# Patient Record
Sex: Female | Born: 1955 | Race: White | Hispanic: No | Marital: Married | Smoking: Never smoker
Health system: Southern US, Community
[De-identification: ages and names within clinical notes are randomized; demographics above are authoritative.]

## PROBLEM LIST (undated history)

## (undated) DIAGNOSIS — T7840XA Allergy, unspecified, initial encounter: Secondary | ICD-10-CM

## (undated) DIAGNOSIS — J329 Chronic sinusitis, unspecified: Secondary | ICD-10-CM

## (undated) DIAGNOSIS — C439 Malignant melanoma of skin, unspecified: Secondary | ICD-10-CM

## (undated) DIAGNOSIS — C801 Malignant (primary) neoplasm, unspecified: Secondary | ICD-10-CM

## (undated) DIAGNOSIS — F411 Generalized anxiety disorder: Secondary | ICD-10-CM

## (undated) HISTORY — DX: Malignant melanoma of skin, unspecified: C43.9

## (undated) HISTORY — PX: OTHER SURGICAL HISTORY: SHX169

## (undated) HISTORY — DX: Allergy, unspecified, initial encounter: T78.40XA

## (undated) HISTORY — PX: COLONOSCOPY: SHX174

## (undated) HISTORY — DX: Generalized anxiety disorder: F41.1

---

## 1998-06-10 ENCOUNTER — Other Ambulatory Visit: Admission: RE | Admit: 1998-06-10 | Discharge: 1998-06-10 | Payer: Self-pay | Admitting: Gynecology

## 1999-09-08 ENCOUNTER — Other Ambulatory Visit: Admission: RE | Admit: 1999-09-08 | Discharge: 1999-09-08 | Payer: Self-pay | Admitting: Obstetrics and Gynecology

## 2000-11-29 ENCOUNTER — Other Ambulatory Visit: Admission: RE | Admit: 2000-11-29 | Discharge: 2000-11-29 | Payer: Self-pay | Admitting: Obstetrics and Gynecology

## 2002-12-04 ENCOUNTER — Other Ambulatory Visit: Admission: RE | Admit: 2002-12-04 | Discharge: 2002-12-04 | Payer: Self-pay | Admitting: Obstetrics and Gynecology

## 2004-11-21 ENCOUNTER — Other Ambulatory Visit: Admission: RE | Admit: 2004-11-21 | Discharge: 2004-11-21 | Payer: Self-pay | Admitting: Obstetrics and Gynecology

## 2006-01-08 ENCOUNTER — Other Ambulatory Visit: Admission: RE | Admit: 2006-01-08 | Discharge: 2006-01-08 | Payer: Self-pay | Admitting: Obstetrics and Gynecology

## 2008-03-22 ENCOUNTER — Encounter (INDEPENDENT_AMBULATORY_CARE_PROVIDER_SITE_OTHER): Payer: Self-pay | Admitting: Obstetrics and Gynecology

## 2008-03-22 ENCOUNTER — Ambulatory Visit (HOSPITAL_COMMUNITY): Admission: RE | Admit: 2008-03-22 | Discharge: 2008-03-22 | Payer: Self-pay | Admitting: Obstetrics and Gynecology

## 2011-02-03 NOTE — Op Note (Signed)
Martha Brock, Martha Brock              ACCOUNT NO.:  [id number]   MEDICAL RECORD NO.:  [id number]          PATIENT TYPE:  AMB   LOCATION:  SDC                           FACILITY:  [facility]   PHYSICIAN:  Michelle L. Grewal, M.D.DATE OF BIRTH:  Sep 11, 1956   DATE OF PROCEDURE:  03/22/2008  DATE OF DISCHARGE:                               OPERATIVE REPORT   PREOPERATIVE DIAGNOSIS:  Menorrhagia.   POSTOPERATIVE DIAGNOSIS:  Menorrhagia.   PROCEDURE:  D&C, hysteroscopy, ThermaChoice endometrial ablation.   SURGEON:  Michelle L. Vincente Poli, MD   ANESTHESIA:  MAC with local.   FINDINGS:  Normal cavities.   SPECIMENS:  Uterine curettings.   PATHOLOGY:  Uterine curettings.   ESTIMATED BLOOD LOSS:  Minimal.   COMPLICATIONS:  None.   PROCEDURE IN DETAIL:  The patient was taken to the operating room where  she was given anesthesia.  She was prepped and draped in the usual  sterile fashion and in-and-out catheter was used to empty the bladder.  After the patient was draped, a speculum was inserted.  The cervix was  grasped with a tenaculum and a paracervical block was performed in a  standard fashion.  The cervical internal os was gently dilated using  Pratt dilators.  The diagnostic hysteroscope was inserted and with good  distention, the cavity was noted to be normal.  The hysteroscope was  removed and a curette was inserted and a thorough uterine curettage was  performed.  This tissue was sent to pathology.  The ThermaChoice III was  inserted and ThermaChoice endometrial ablation was performed for 8  minutes according to the manufacturer's specifications.  The pressure  maintained at around the 150 throughout the procedure.  At the end of  the procedure, the intact balloon was removed.  Approximately 12 mL was  inserted through the balloon.  All instruments were removed from the  vagina.  All sponge, lap, and instrument counts were correct x2.  The  patient went to recovery room in stable  condition.      Michelle L. Vincente Poli, M.D.  Electronically Signed    MLG/MEDQ  D:  03/22/2008  T:  03/23/2008  Job:  [id number]

## 2011-06-18 LAB — CBC
HCT: 36.4
Hemoglobin: 12.5
MCHC: 34.5
MCV: 97.7
Platelets: 315
RBC: 3.72 — ABNORMAL LOW
RDW: 12.6
WBC: 4.8

## 2011-06-18 LAB — DIFFERENTIAL
Basophils Absolute: 0
Basophils Relative: 1
Eosinophils Absolute: 0.1
Eosinophils Relative: 1
Lymphocytes Relative: 23
Lymphs Abs: 1.1
Monocytes Absolute: 0.4
Monocytes Relative: 8
Neutro Abs: 3.2
Neutrophils Relative %: 67

## 2011-06-18 LAB — PREGNANCY, URINE: Preg Test, Ur: NEGATIVE

## 2012-09-21 HISTORY — PX: MELANOMA EXCISION: SHX5266

## 2016-07-21 ENCOUNTER — Telehealth: Payer: Self-pay

## 2016-07-21 NOTE — Telephone Encounter (Signed)
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

## 2016-07-31 NOTE — Telephone Encounter (Signed)
Triaged today. She had previous chart, Martha Brock.

## 2016-08-06 NOTE — Telephone Encounter (Signed)
I have sent IT a note that I need the charts combined.

## 2016-08-18 NOTE — Telephone Encounter (Signed)
Gastroenterology Pre-Procedure Review  Pt's husband sees Dr. Gala Romney  Her last one done in [place] and doctor retired  Request Date: 08/18/2016 Requesting Physician: PT is requesting  PATIENT REVIEW QUESTIONS: The patient responded to the following health history questions as indicated:    1. Diabetes Melitis: no 2. Joint replacements in the past 12 months: no 3. Major health problems in the past 3 months: no 4. Has an artificial valve or MVP: no 5. Has a defibrillator: no 6. Has been advised in past to take antibiotics in advance of a procedure like teeth cleaning: no 7. Family history of colon cancer: no  8. Alcohol Use: occasionally 9. History of sleep apnea: no  10. History of coronary artery or other vascular stents placed within the last 12 months: no    MEDICATIONS & ALLERGIES:    Patient reports the following regarding taking any blood thinners:   Plavix? no Aspirin? no Coumadin? no Brilinta? no Xarelto? no Eliquis? no Pradaxa? no Savaysa? no Effient? no  Patient confirms/reports the following medications:  Current Outpatient Prescriptions  Medication Sig Dispense Refill  . Diethylpropion HCl CR 75 MG TB24 Take by mouth daily.     No current facility-administered medications for this visit.     Patient confirms/reports the following allergies:  Allergies  Allergen Reactions  . Sulfa Antibiotics Nausea Only    No orders of the defined types were placed in this encounter.   AUTHORIZATION INFORMATION Primary Insurance:   ID #: Group #:  Pre-Cert / Auth required:  Pre-Cert / Auth #:   Secondary Insurance:   ID #:   Group #:  Pre-Cert / Auth required:  Pre-Cert / Auth #:   SCHEDULE INFORMATION: Procedure has been scheduled as follows:  Date:                   Time:   Location:   This Gastroenterology Pre-Precedure Review Form is being routed to the following provider(s): R. Garfield Cornea, MD

## 2016-08-20 NOTE — Telephone Encounter (Signed)
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 She would like to just wait til after the first of the year and be scheduled on a Friday.  Tentatively scheduled for 09/25/2016 at 9:30 AM and I will send in a new triage at that time.

## 2016-09-22 ENCOUNTER — Telehealth: Payer: Self-pay

## 2016-09-22 ENCOUNTER — Other Ambulatory Visit: Payer: Self-pay

## 2016-09-22 DIAGNOSIS — Z1211 Encounter for screening for malignant neoplasm of colon: Secondary | ICD-10-CM

## 2016-09-22 MED ORDER — PEG 3350-KCL-NA BICARB-NACL 420 G PO SOLR: 4000.0000 mL | ORAL | 0 refills | Status: DC

## 2016-09-22 NOTE — Telephone Encounter (Signed)
Pt is aware of instructions faxed to the pharmacy and Rx was sent in. She will call tomorrow if she has questions.

## 2016-09-22 NOTE — Telephone Encounter (Signed)
How much alcohol?

## 2016-09-22 NOTE — Telephone Encounter (Signed)
Just occasional.

## 2016-09-22 NOTE — Telephone Encounter (Signed)
Gastroenterology Pre-Procedure Review  Request Date: 09/22/2016 Requesting Physician:   PATIENT REVIEW QUESTIONS: The patient responded to the following health history questions as indicated:    1. Diabetes Melitis: no 2. Joint replacements in the past 12 months: no 3. Major health problems in the past 3 months: no 4. Has an artificial valve or MVP: no 5. Has a defibrillator: no 6. Has been advised in past to take antibiotics in advance of a procedure like teeth cleaning: no 7. Family history of colon cancer: no  8. Alcohol Use: YES 9. History of sleep apnea: no  10. History of coronary artery or other vascular stents placed within the last 12 months: no    MEDICATIONS & ALLERGIES:    Patient reports the following regarding taking any blood thinners:   Plavix? no Aspirin? no Coumadin? no Brilinta? no Xarelto? no Eliquis? no Pradaxa? no Savaysa? no Effient? no  Patient confirms/reports the following medications:  Current Outpatient Prescriptions  Medication Sig Dispense Refill  . Diethylpropion HCl CR 75 MG TB24 Take by mouth daily.     No current facility-administered medications for this visit.     Patient confirms/reports the following allergies:  Allergies  Allergen Reactions  . Sulfa Antibiotics Nausea Only    No orders of the defined types were placed in this encounter.   AUTHORIZATION INFORMATION Primary Insurance:   ID #:  Group #:  Pre-Cert / Auth required:  Pre-Cert / Auth #:   Secondary Insurance:  ID #:   Group #:  Pre-Cert / Auth required:  Pre-Cert / Auth #:   SCHEDULE INFORMATION: Procedure has been scheduled as follows:  Date: 09/25/2016               Time: 9:45 Am Location: [facility] Short Stay  This Gastroenterology Pre-Precedure Review Form is being routed to the following provider(s): R. Garfield Cornea, MD

## 2016-09-22 NOTE — Telephone Encounter (Signed)
Appropriate.

## 2016-09-24 NOTE — Telephone Encounter (Signed)
No PA is needed for TCS 

## 2016-09-25 ENCOUNTER — Encounter (HOSPITAL_COMMUNITY)
Admission: RE | Disposition: A | Discharge status: Home / Self Care | Payer: Self-pay | Source: Ambulatory Visit | Attending: Internal Medicine

## 2016-09-25 ENCOUNTER — Ambulatory Visit (HOSPITAL_COMMUNITY)
Admission: RE | Admit: 2016-09-25 | Discharge: 2016-09-25 | Disposition: A | Discharge status: Home / Self Care | Payer: BLUE CROSS/BLUE SHIELD | Source: Ambulatory Visit | Attending: Internal Medicine | Admitting: Internal Medicine

## 2016-09-25 ENCOUNTER — Encounter (HOSPITAL_COMMUNITY): Payer: Self-pay | Admitting: *Deleted

## 2016-09-25 DIAGNOSIS — K640 First degree hemorrhoids: Secondary | ICD-10-CM | POA: Insufficient documentation

## 2016-09-25 DIAGNOSIS — Z1212 Encounter for screening for malignant neoplasm of rectum: Secondary | ICD-10-CM | POA: Diagnosis not present

## 2016-09-25 DIAGNOSIS — K5730 Diverticulosis of large intestine without perforation or abscess without bleeding: Secondary | ICD-10-CM | POA: Diagnosis not present

## 2016-09-25 DIAGNOSIS — Z1211 Encounter for screening for malignant neoplasm of colon: Secondary | ICD-10-CM | POA: Diagnosis not present

## 2016-09-25 DIAGNOSIS — Z79899 Other long term (current) drug therapy: Secondary | ICD-10-CM | POA: Diagnosis not present

## 2016-09-25 HISTORY — DX: Malignant (primary) neoplasm, unspecified: C80.1

## 2016-09-25 HISTORY — PX: COLONOSCOPY: SHX5424

## 2016-09-25 HISTORY — DX: Chronic sinusitis, unspecified: J32.9

## 2016-09-25 SURGERY — COLONOSCOPY
Anesthesia: Moderate Sedation

## 2016-09-25 MED ORDER — SODIUM CHLORIDE 0.9 % IV SOLN
INTRAVENOUS | Status: DC
  Administered 2016-09-25: 09:00:00 via INTRAVENOUS

## 2016-09-25 MED ORDER — MEPERIDINE HCL 100 MG/ML IJ SOLN
INTRAMUSCULAR | Status: DC | PRN
  Administered 2016-09-25: 50 mg via INTRAVENOUS
  Administered 2016-09-25 (×2): 25 mg via INTRAVENOUS

## 2016-09-25 MED ORDER — MIDAZOLAM HCL 5 MG/5ML IJ SOLN
INTRAMUSCULAR | Status: DC
Start: 2016-09-25 — End: 2016-09-25
  Filled 2016-09-25: qty 10

## 2016-09-25 MED ORDER — ONDANSETRON HCL 4 MG/2ML IJ SOLN
INTRAMUSCULAR | Status: DC | PRN
  Administered 2016-09-25: 4 mg via INTRAVENOUS

## 2016-09-25 MED ORDER — ONDANSETRON HCL 4 MG/2ML IJ SOLN
INTRAMUSCULAR | Status: AC
  Filled 2016-09-25: qty 2

## 2016-09-25 MED ORDER — MEPERIDINE HCL 100 MG/ML IJ SOLN
INTRAMUSCULAR | Status: AC
  Filled 2016-09-25: qty 2

## 2016-09-25 MED ORDER — MIDAZOLAM HCL 5 MG/5ML IJ SOLN
INTRAMUSCULAR | Status: DC | PRN
  Administered 2016-09-25: 1 mg via INTRAVENOUS
  Administered 2016-09-25: 2 mg via INTRAVENOUS
  Administered 2016-09-25: 1 mg via INTRAVENOUS
  Administered 2016-09-25: 2 mg via INTRAVENOUS
  Administered 2016-09-25: 1 mg via INTRAVENOUS

## 2016-09-25 MED ORDER — STERILE WATER FOR IRRIGATION IR SOLN
Status: DC | PRN
  Administered 2016-09-25: 2.5 mL

## 2016-09-25 NOTE — H&P (Signed)
@LOGO @   Primary Care Physician:  Cyril Mourning, MD Primary Gastroenterologist:  Dr. Gala Romney  Pre-Procedure History & Physical: HPI:  Martha Brock is a 61 y.o. female is here for a screening colonoscopy. Negative colonoscopy 10 years ago in [place].  No bowel symptoms. No family history of colon cancer.  Past Medical History:  Diagnosis Date  . Sinusitis     Past Surgical History:  Procedure Laterality Date  . CESAREAN SECTION     X 2  . COLONOSCOPY    . Hysteroscopy and ablation       Prior to Admission medications   Medication Sig Start Date End Date Taking? Authorizing Provider  Acetaminophen-Caffeine (EXCEDRIN TENSION HEADACHE) 500-65 MG TABS Take 2 tablets by mouth daily as needed (headaches).   Yes Historical Provider, MD  Ascorbic Acid (VITAMIN C) 1000 MG tablet Take 1,000 mg by mouth daily.   Yes Historical Provider, MD  Chlorpheniramine-Phenylephrine ([facility] S[facility] ALLERGY MAX ST PO) Take 1 tablet by mouth daily as needed (allergies).   Yes Historical Provider, MD  Cholecalciferol (VITAMIN D3) 3000 units TABS Take 3,000 Units by mouth daily.   Yes Historical Provider, MD  Diethylpropion HCl CR 75 MG TB24 Take 75 mg by mouth daily.    Yes Historical Provider, MD  Glucosamine HCl (GLUCOSAMINE PO) Take 1 tablet by mouth daily.   Yes Historical Provider, MD  Ibuprofen-Diphenhydramine HCl (ADVIL PM) 200-25 MG CAPS Take 1-2 tablets by mouth daily as needed (pain/sleep).   Yes Historical Provider, MD  polyethylene glycol-electrolytes (TRILYTE) 420 g solution Take 4,000 mLs by mouth as directed. 09/22/16  Yes Daneil Dolin, MD    Allergies as of 09/22/2016 - Review Complete 09/22/2016  Allergen Reaction Noted  . Sulfa antibiotics Nausea Only 08/18/2016    Family History  Problem Relation Age of Onset  . Colon cancer Neg Hx     Social History   Social History  . Marital status: Married    Spouse name: N/A  . Number of children: N/A  . Years of education: N/A    Occupational History  . Not on file.   Social History Main Topics  . Smoking status: Never Smoker  . Smokeless tobacco: Never Used  . Alcohol use Yes     Comment: occasionally  . Drug use: No  . Sexual activity: Not on file   Other Topics Concern  . Not on file   Social History Narrative  . No narrative on file    Review of Systems: See HPI, otherwise negative ROS  Physical Exam: BP 140/74   Pulse 90   Temp 98.4 F (36.9 C) (Oral)   Resp 12   Ht 5' 3.5" (1.613 m)   Wt 168 lb (76.2 kg)   SpO2 99%   BMI 29.29 kg/m  Mouth:  No deformity or lesions, dentition normal. Neck:  Supple; no masses or thyromegaly. Lungs:  Clear throughout to auscultation.   No wheezes, crackles, or rhonchi. No acute distress. Heart:  Regular rate and rhythm; no murmurs, clicks, rubs,  or gallops. Abdomen:  Soft, nontender and nondistended. No masses, hepatosplenomegaly or hernias noted. Normal bowel sounds, without guarding, and without rebound.     Impression/Plan: Martha Brock is now here to undergo a screening colonoscopy.  Average risk examination.     Risks, benefits, limitations, imponderables and alternatives regarding colonoscopy have been reviewed with the patient. Questions have been answered. All parties agreeable.     Notice:  This dictation was prepared with  Dragon dictation along with smaller Company secretary. Any transcriptional errors that result from this process are unintentional and may not be corrected upon review.

## 2016-09-25 NOTE — Op Note (Signed)
[facility] Patient Name: Martha Brock Procedure Date: 09/25/2016 9:06 AM MRN: [id number] Date of Birth: Feb 07, 1956 Attending MD: Norvel Richards , MD CSN: [id number] Age: 61 Admit Type: Outpatient Procedure:                Colonoscopy - screening Indications:              Screening for colorectal malignant neoplasm Providers:                Norvel Richards, MD, Lurline Del, RN, Purcell Nails.                            [place], Merchant navy officer, Aram Candela Referring MD:              Medicines:                Midazolam 7 mg IV, Meperidine 100 mg IV,                            Ondansetron 4 mg IV Complications:            No immediate complications. Estimated Blood Loss:     Estimated blood loss: none. Procedure:                Pre-Anesthesia Assessment:                           - Prior to the procedure, a History and Physical                            was performed, and patient medications and                            allergies were reviewed. The patient's tolerance of                            previous anesthesia was also reviewed. The risks                            and benefits of the procedure and the sedation                            options and risks were discussed with the patient.                            All questions were answered, and informed consent                            was obtained. Prior Anticoagulants: The patient has                            taken no previous anticoagulant or antiplatelet                            agents. ASA Grade Assessment: II - A patient with  mild systemic disease. After reviewing the risks                            and benefits, the patient was deemed in                            satisfactory condition to undergo the procedure.                           After obtaining informed consent, the colonoscope                            was passed under direct vision. Throughout the                             procedure, the patient's blood pressure, pulse, and                            oxygen saturations were monitored continuously. The                            [id number] scope was introduced through                            the anus and advanced to the 5 cm into the ileum.                            The colonoscopy was performed without difficulty.                            The patient tolerated the procedure well. The                            quality of the bowel preparation was adequate. The                            terminal ileum, ileocecal valve, appendiceal                            orifice, and rectum were photographed. The quality                            of the bowel preparation was adequate. The entire                            colon was well visualized. Scope In: 9:37:49 AM Scope Out: 9:54:38 AM Scope Withdrawal Time: 0 hours 7 minutes 34 seconds  Total Procedure Duration: 0 hours 16 minutes 49 seconds  Findings:      The perianal and digital rectal examinations were normal. Grade 1       hemorrhoids and anal papilla.      Scattered diverticula were found in the sigmoid colon and descending       colon.      The exam was otherwise without abnormality  on direct and retroflexion       views. Impression:               - Diverticulosis in the sigmoid colon and in the                            descending colon.                           - The examination was otherwise normal on direct                            and retroflexion views.                           - No specimens collected. Moderate Sedation:      Moderate (conscious) sedation was administered by the endoscopy nurse       and supervised by the endoscopist. The following parameters were       monitored: oxygen saturation, heart rate, blood pressure, respiratory       rate, EKG, adequacy of pulmonary ventilation, and response to care.       Total physician intraservice time was 26  minutes. Recommendation:           - Patient has a contact number available for                            emergencies. The signs and symptoms of potential                            delayed complications were discussed with the                            patient. Return to normal activities tomorrow.                            Written discharge instructions were provided to the                            patient.                           - Resume previous diet.                           - Continue present medications.                           - Repeat colonoscopy in 10 years for screening                            purposes.                           - Return to GI clinic PRN. Procedure Code(s):        --- Professional ---  [phone], Colonoscopy, flexible; diagnostic, including                            collection of specimen(s) by brushing or washing,                            when performed (separate procedure)                           99152, Moderate sedation services provided by the                            same physician or other qualified health care                            professional performing the diagnostic or                            therapeutic service that the sedation supports,                            requiring the presence of an independent trained                            observer to assist in the monitoring of the                            patient's level of consciousness and physiological                            status; initial 15 minutes of intraservice time,                            patient age 78 years or older                           [phone], Moderate sedation services; each additional                            15 minutes intraservice time Diagnosis Code(s):        --- Professional ---                           Z12.11, Encounter for screening for malignant                            neoplasm of colon                            K57.30, Diverticulosis of large intestine without                            perforation or abscess without bleeding CPT copyright 2016 American Medical Association. All rights reserved. The codes documented in this report are preliminary and upon coder review may  be revised to meet  current compliance requirements. Cristopher Estimable. Veto Macqueen, MD Norvel Richards, MD 09/25/2016 10:03:12 AM This report has been signed electronically. Number of Addenda: 0

## 2016-09-25 NOTE — Discharge Instructions (Addendum)
Colonoscopy Discharge Instructions  Read the instructions outlined below and refer to this sheet in the next few weeks. These discharge instructions provide you with general information on caring for yourself after you leave the hospital. Your doctor may also give you specific instructions. While your treatment has been planned according to the most current medical practices available, unavoidable complications occasionally occur. If you have any problems or questions after discharge, call Dr. Gala Romney at [phone]. ACTIVITY  You may resume your regular activity, but move at a slower pace for the next 24 hours.   Take frequent rest periods for the next 24 hours.   Walking will help get rid of the air and reduce the bloated feeling in your belly (abdomen).   No driving for 24 hours (because of the medicine (anesthesia) used during the test).    Do not sign any important legal documents or operate any machinery for 24 hours (because of the anesthesia used during the test).  NUTRITION  Drink plenty of fluids.   You may resume your normal diet as instructed by your doctor.   Begin with a light meal and progress to your normal diet. Heavy or fried foods are harder to digest and may make you feel sick to your stomach (nauseated).   Avoid alcoholic beverages for 24 hours or as instructed.  MEDICATIONS  You may resume your normal medications unless your doctor tells you otherwise.  WHAT YOU CAN EXPECT TODAY  Some feelings of bloating in the abdomen.   Passage of more gas than usual.   Spotting of blood in your stool or on the toilet paper.  IF YOU HAD POLYPS REMOVED DURING THE COLONOSCOPY:  No aspirin products for 7 days or as instructed.   No alcohol for 7 days or as instructed.   Eat a soft diet for the next 24 hours.  FINDING OUT THE RESULTS OF YOUR TEST Not all test results are available during your visit. If your test results are not back during the visit, make an appointment  with your caregiver to find out the results. Do not assume everything is normal if you have not heard from your caregiver or the medical facility. It is important for you to follow up on all of your test results.  SEEK IMMEDIATE MEDICAL ATTENTION IF:  You have more than a spotting of blood in your stool.   Your belly is swollen (abdominal distention).   You are nauseated or vomiting.   You have a temperature over 101.   You have abdominal pain or discomfort that is severe or gets worse throughout the day.    Diverticulosis information provided  Repeat screening colonoscopy in 10 years   Diverticulosis Diverticulosis is the condition that develops when small pouches (diverticula) form in the wall of your colon. Your colon, or large intestine, is where water is absorbed and stool is formed. The pouches form when the inside layer of your colon pushes through weak spots in the outer layers of your colon. CAUSES  No one knows exactly what causes diverticulosis. RISK FACTORS  Being older than 24. Your risk for this condition increases with age. Diverticulosis is rare in people younger than 40 years. By age 52, almost everyone has it.  Eating a low-fiber diet.  Being frequently constipated.  Being overweight.  Not getting enough exercise.  Smoking.  Taking over-the-counter pain medicines, like aspirin and ibuprofen. SYMPTOMS  Most people with diverticulosis do not have symptoms. DIAGNOSIS  Because diverticulosis often has no  symptoms, health care providers often discover the condition during an exam for other colon problems. In many cases, a health care provider will diagnose diverticulosis while using a flexible scope to examine the colon (colonoscopy). TREATMENT  If you have never developed an infection related to diverticulosis, you may not need treatment. If you have had an infection before, treatment may include:  Eating more fruits, vegetables, and grains.  Taking a  fiber supplement.  Taking a live bacteria supplement (probiotic).  Taking medicine to relax your colon. HOME CARE INSTRUCTIONS   Drink at least 6-8 glasses of water each day to prevent constipation.  Try not to strain when you have a bowel movement.  Keep all follow-up appointments. If you have had an infection before:  Increase the fiber in your diet as directed by your health care provider or dietitian.  Take a dietary fiber supplement if your health care provider approves.  Only take medicines as directed by your health care provider. SEEK MEDICAL CARE IF:   You have abdominal pain.  You have bloating.  You have cramps.  You have not gone to the bathroom in 3 days. SEEK IMMEDIATE MEDICAL CARE IF:   Your pain gets worse.  Yourbloating becomes very bad.  You have a fever or chills, and your symptoms suddenly get worse.  You begin vomiting.  You have bowel movements that are bloody or black. MAKE SURE YOU:  Understand these instructions.  Will watch your condition.  Will get help right away if you are not doing well or get worse. This information is not intended to replace advice given to you by your health care provider. Make sure you discuss any questions you have with your health care provider. Document Released: 06/04/2004 Document Revised: 09/12/2013 Document Reviewed: 08/02/2013 Elsevier Interactive Patient Education  2017 [employer].

## 2016-09-28 ENCOUNTER — Encounter (HOSPITAL_COMMUNITY): Payer: Self-pay | Admitting: Internal Medicine

## 2017-05-21 ENCOUNTER — Encounter: Payer: Self-pay | Admitting: Family Medicine

## 2017-05-21 ENCOUNTER — Ambulatory Visit (INDEPENDENT_AMBULATORY_CARE_PROVIDER_SITE_OTHER): Payer: BLUE CROSS/BLUE SHIELD | Admitting: Family Medicine

## 2017-05-21 VITALS — BP 130/80 | HR 92 | Temp 98.6°F | Resp 16 | Ht 64.0 in | Wt 164.0 lb

## 2017-05-21 DIAGNOSIS — Z85820 Personal history of malignant melanoma of skin: Secondary | ICD-10-CM | POA: Diagnosis not present

## 2017-05-21 DIAGNOSIS — Z23 Encounter for immunization: Secondary | ICD-10-CM

## 2017-05-21 DIAGNOSIS — Z9109 Other allergy status, other than to drugs and biological substances: Secondary | ICD-10-CM | POA: Insufficient documentation

## 2017-05-21 DIAGNOSIS — F411 Generalized anxiety disorder: Secondary | ICD-10-CM

## 2017-05-21 NOTE — Patient Instructions (Addendum)
Need shingles shot "shingrix"- this is given at the pharmacy No change in medicines Continue to eat well and stay active We will do flu shot today and prevnar 13 Come back for tetanus booster  I need records from GYN  See me yearly

## 2017-05-21 NOTE — Progress Notes (Signed)
Chief Complaint  Patient presents with  . Follow-up    est  healthy 61 year old Here to establish care Previous care through her GYN Yearly exams, PAP, and mammogram with regular labs Never had a flu shot Tetanus unknown. 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  She sees cancer doc and derm every 6 months  Patient Active Problem List   Diagnosis Date Noted  . History of melanoma 05/21/2017  . Anxiety, generalized 05/21/2017  . Environmental allergies 05/21/2017    Outpatient Encounter Prescriptions as of 05/21/2017  Medication Sig  . Acetaminophen-Caffeine (EXCEDRIN TENSION HEADACHE) 500-65 MG TABS Take 2 tablets by mouth daily as needed (headaches).  . Ascorbic Acid (VITAMIN C) 1000 MG tablet Take 1,000 mg by mouth daily.  . Chlorpheniramine-Phenylephrine ([employer] SINUS & ALLERGY MAX ST PO) Take 1 tablet by mouth daily as needed (allergies).  . Cholecalciferol (VITAMIN D3) 3000 units TABS Take 3,000 Units by mouth daily.  Marland Kitchen FLUoxetine (PROZAC) 20 MG capsule Take by mouth.  . Glucosamine HCl (GLUCOSAMINE PO) Take 1 tablet by mouth daily.  . Ibuprofen-Diphenhydramine HCl (ADVIL PM) 200-25 MG CAPS Take 1-2 tablets by mouth daily as needed (pain/sleep).   No facility-administered encounter medications on file as of 05/21/2017.     Past Medical History:  Diagnosis Date  . Allergy    hayfever  . Blood transfusion without reported diagnosis   . Cancer ([place])    scalp melanoma, 2014 [facility], surgery, no chemo, no radiation  . Sinusitis     Past Surgical History:  Procedure Laterality Date  . CESAREAN SECTION     X 2  . COLONOSCOPY    . COLONOSCOPY N/A 09/25/2016   Procedure: COLONOSCOPY;  Surgeon: Daneil Dolin, MD;  [facility];  Service: Endoscopy;  Laterality: N/A;  9:45 AM  .  Hysteroscopy and ablation     . MELANOMA EXCISION  2014   x 3    Social History   Social History  . Marital status: Married    Spouse name: Richardson Landry  . Number of children: 2  . Years of education: 13   Occupational History  . hair dresser    Social History Main Topics  . Smoking status: Never Smoker  . Smokeless tobacco: Never Used  . Alcohol use Yes     Comment: occasionally  . Drug use: No  . Sexual activity: Yes    Birth control/ protection: Surgical   Other Topics Concern  . Not on file   Social History Narrative   Lives with Richardson Landry - husband of 5 years   Horses   Self employed hair dresser    Family History  Problem Relation Age of Onset  . Arthritis Mother   . Diabetes Mother   . Heart disease Mother   . Hypertension Mother   . Hyperlipidemia Mother   . Early death Father 80       quadriplegic  . Hypertension Brother   . Diabetes Brother   . Epilepsy Brother   . Stroke Maternal Grandmother   . Heart disease Maternal Grandmother   . Heart disease Maternal Grandfather   . COPD Maternal Grandfather        farmers lung  . Colon cancer Neg Hx     Review of Systems  Constitutional: Negative for  chills, fever and weight loss.  HENT: Negative for congestion and hearing loss.   Eyes: Negative for blurred vision and pain.  Respiratory: Negative for cough and shortness of breath.   Cardiovascular: Negative for chest pain and leg swelling.  Gastrointestinal: Negative for abdominal pain, constipation, diarrhea and heartburn.  Genitourinary: Negative for dysuria and frequency.  Musculoskeletal: Negative for falls, joint pain and myalgias.  Neurological: Negative for dizziness, seizures and headaches.  Psychiatric/Behavioral: Negative for depression. The patient is not nervous/anxious and does not have insomnia.        Feels well on medicine    BP 130/80 (BP Location: Left Arm, Patient Position: Sitting, Cuff Size: Normal)   Pulse 92   Temp 98.6 F (37 C)  (Oral)   Resp 16   Ht 5\' 4"  (1.626 m)   Wt 164 lb 0.6 oz (74.4 kg)   SpO2 98%   BMI 28.16 kg/m   Physical Exam  Constitutional: She is oriented to person, place, and time. She appears well-developed and well-nourished.  HENT:  Head: Normocephalic and atraumatic.  Mouth/Throat: Oropharynx is clear and moist.  Eyes: Pupils are equal, round, and reactive to light. Conjunctivae are normal.  Neck: Normal range of motion. Neck supple. No thyromegaly present.  Cardiovascular: Normal rate, regular rhythm and normal heart sounds.   Pulmonary/Chest: Effort normal and breath sounds normal. No respiratory distress.  Musculoskeletal: Normal range of motion. She exhibits no edema.  Lymphadenopathy:    She has no cervical adenopathy.  Neurological: She is alert and oriented to person, place, and time.  Gait normal  Skin: Skin is warm and dry.  Well healed scar on crown  Psychiatric: She has a normal mood and affect. Her behavior is normal. Thought content normal.  Nursing note and vitals reviewed. ASSESSMENT/PLAN:   1. History of melanoma treated  2. Need for influenza vaccination done - Flu Vaccine QUAD 36+ mos IM  3. Anxiety, generalized On flupxetine  4. Environmental allergies Prn meds OTC   Patient Instructions  Need shingles shot "shingrix"- this is given at the pharmacy No change in medicines Continue to eat well and stay active We will do flu shot today and prevnar 13 Come back for tetanus booster  I need records from GYN  See me yearly   Raylene Everts, MD

## 2017-07-09 ENCOUNTER — Ambulatory Visit: Payer: BLUE CROSS/BLUE SHIELD | Admitting: Family Medicine

## 2018-02-04 ENCOUNTER — Ambulatory Visit: Payer: BLUE CROSS/BLUE SHIELD | Admitting: Family Medicine

## 2018-02-11 ENCOUNTER — Encounter: Payer: Self-pay | Admitting: Family Medicine

## 2018-02-11 ENCOUNTER — Ambulatory Visit: Payer: BLUE CROSS/BLUE SHIELD | Admitting: Family Medicine

## 2018-02-11 VITALS — BP 140/84 | HR 82 | Resp 16 | Ht 64.0 in | Wt 162.1 lb

## 2018-02-11 DIAGNOSIS — R030 Elevated blood-pressure reading, without diagnosis of hypertension: Secondary | ICD-10-CM | POA: Diagnosis not present

## 2018-02-11 DIAGNOSIS — Z23 Encounter for immunization: Secondary | ICD-10-CM | POA: Diagnosis not present

## 2018-02-11 NOTE — Patient Instructions (Addendum)
Shingrix vaccine-check with insurance.   Follow up for recheck of blood pressure.  Request labs from Dr. Tressia Danas

## 2018-02-11 NOTE — Progress Notes (Signed)
Patient ID: Martha Brock, female    DOB: 11-21-1955, 63 y.o.   MRN: [id number]  Chief Complaint  Patient presents with  . Establish Care    former [facility] patient   . Anxiety    Allergies Other and Sulfa antibiotics  Subjective:   Martha Brock is a 62 y.o. female who presents to [facility] today.  HPI Here to establish care.  She reports that her insurance requires her to have a primary care physician.  She reports that most of her medical needs are cared for by her gynecologist.  She reports that her gynecologist gives her appetite suppressant medication and medication for her mood.  She reports that she takes appetite suppressant occasionally.  She also reports that she takes the medication for her mood on an as-needed basis.  She reports that she does not feel down, depressed, or hopeless.  She reports that she has high strong and can get anxious from time to time.  She reports there have been some family stressors going on in her life.  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  She is followed by [facility].  She reports that she is doing well.  She does not have any concerns.  She does not wish for me to draw any blood work.  She does not wish to come in for a full physical.  She reports that her gynecologist draws all her blood work and reviews it with her.  She said she was fine with me getting a copy of those results.  She believes that she has had some vaccinations in the past at [facility].  She is due for tetanus shot.  Her past medical history, family history, social history, and past surgical history are reviewed and updated today.  She does report that she has never had a history of high blood pressure.  She reports it is been slightly elevated in the past but upon resting the reading would come down.  She denies any chest pain, shortness of breath, palpitations, or  swelling in her extremities.  She sleeps well.  Energy is good.  Reports she is very active.   Past Medical History:  Diagnosis Date  . Allergy    hayfever  . Anxiety, generalized   . Blood transfusion without reported diagnosis   . Cancer ([place])    scalp melanoma, 2014 [facility], surgery, no chemo, no radiation  . Sinusitis     Past Surgical History:  Procedure Laterality Date  . CESAREAN SECTION     X 2  . COLONOSCOPY    . COLONOSCOPY N/A 09/25/2016   Procedure: COLONOSCOPY;  Surgeon: Daneil Dolin, MD;  [facility];  Service: Endoscopy;  Laterality: N/A;  9:45 AM  . Hysteroscopy and ablation     . MELANOMA EXCISION  2014   x 3    Family History  Problem Relation Age of Onset  . Arthritis Mother   . Diabetes Mother   . Heart disease Mother   . Hypertension Mother   . Hyperlipidemia Mother   . Early death Father 40       quadriplegic  . AAA (abdominal aortic aneurysm) Father   . Hypertension Brother   . Diabetes Brother   . Epilepsy Brother   . Stroke Maternal Grandmother   . Heart disease Maternal Grandmother   . Heart disease Maternal Grandfather   .  COPD Maternal Grandfather        farmers lung  . Colon cancer Neg Hx      Social History   Socioeconomic History  . Marital status: Married    Spouse name: Richardson Landry  . Number of children: 2  . Years of education: 43  . Highest education level: Not on file  Occupational History  . Occupation: Emergency planning/management officer  Social Needs  . Financial resource strain: Not on file  . Food insecurity:    Worry: Not on file    Inability: Not on file  . Transportation needs:    Medical: Not on file    Non-medical: Not on file  Tobacco Use  . Smoking status: Never Smoker  . Smokeless tobacco: Never Used  Substance and Sexual Activity  . Alcohol use: Yes    Comment: occasionally  . Drug use: No  . Sexual activity: Yes    Birth control/protection: Surgical  Lifestyle  . Physical activity:    Days per week: Not on file     Minutes per session: Not on file  . Stress: Not on file  Relationships  . Social connections:    Talks on phone: Not on file    Gets together: Not on file    Attends religious service: Not on file    Active member of club or organization: Not on file    Attends meetings of clubs or organizations: Not on file    Relationship status: Not on file  Other Topics Concern  . Not on file  Social History Narrative   Lives with Richardson Landry - husband of 5 years   [address] [place], [place].      Enjoys quarter horses   Self employed hair dresser   Eats all food groups.   Wear seat belt.   Exercise. Works 3 days a week. Has a farm.    Current Outpatient Medications on File Prior to Visit  Medication Sig Dispense Refill  . Acetaminophen-Caffeine (EXCEDRIN TENSION HEADACHE) 500-65 MG TABS Take 2 tablets by mouth daily as needed (headaches).    . Ascorbic Acid (VITAMIN C) 1000 MG tablet Take 1,000 mg by mouth daily.    . Chlorpheniramine-Phenylephrine ([employer]S SINUS & ALLERGY MAX ST PO) Take 1 tablet by mouth daily as needed (allergies).    . Cholecalciferol (VITAMIN D3) 3000 units TABS Take 3,000 Units by mouth daily.    Marland Kitchen FLUoxetine (PROZAC) 20 MG capsule Take by mouth.    . Glucosamine HCl (GLUCOSAMINE PO) Take 1 tablet by mouth daily.    . Ibuprofen-Diphenhydramine HCl (ADVIL PM) 200-25 MG CAPS Take 1-2 tablets by mouth daily as needed (pain/sleep).     No current facility-administered medications on file prior to visit.     Review of Systems  Constitutional: Negative for activity change, appetite change and fever.  Eyes: Negative for visual disturbance.  Respiratory: Negative for cough, chest tightness and shortness of breath.   Cardiovascular: Negative for chest pain, palpitations and leg swelling.  Gastrointestinal: Negative for abdominal pain, nausea and vomiting.  Genitourinary: Negative for dysuria, frequency and urgency.  Neurological: Negative for dizziness, syncope and  light-headedness.  Hematological: Negative for adenopathy.     Objective:   BP 140/84   Pulse 82   Resp 16   Ht 5\' 4"  (1.626 m)   Wt 162 lb 1.9 oz (73.5 kg)   SpO2 97%   BMI 27.83 kg/m   Physical Exam  Constitutional: She is oriented to person, place,  and time. She appears well-developed and well-nourished. No distress.  HENT:  Head: Normocephalic and atraumatic.  Eyes: Pupils are equal, round, and reactive to light. Conjunctivae and EOM are normal. No scleral icterus.  Neck: Normal range of motion. Neck supple. No thyromegaly present.  Cardiovascular: Normal rate, regular rhythm, normal heart sounds and intact distal pulses.  Abdominal: Soft. Bowel sounds are normal.  Neurological: She is alert and oriented to person, place, and time. No cranial nerve deficit.  Skin: Skin is warm and dry. Capillary refill takes less than 2 seconds. She is not diaphoretic.  Psychiatric: She has a normal mood and affect. Her behavior is normal. Judgment and thought content normal.   Depression screen [facility] 2/9 05/21/2017  Decreased Interest 0  Down, Depressed, Hopeless 0  PHQ - 2 Score 0    Assessment and Plan  .1. Elevated blood pressure reading without diagnosis of hypertension  Lifestyle modifications discussed with patient including a diet emphasizing vegetables, fruits, and whole grains. Limiting intake of sodium to less than 2,400 mg per day.  Recommendations discussed include consuming low-fat dairy products, poultry, fish, legumes, non-tropical vegetable oils, and nuts; and limiting intake of sweets, sugar-sweetened beverages, and red meat. Discussed following a plan such as the Dietary Approaches to Stop Hypertension (DASH) diet. Patient to read up on this diet.   Did recommend to patient that she follow-up and have her blood pressure rechecked in the next month or so.  Continue diet, exercise, and weight monitoring.  I did discuss with her that phentermine/appetite stimulants could  elevate blood pressure.  In addition we did discuss that she should refrain from taking any decongestants secondary to the fact that could also elevate her blood pressure.  She voiced understanding. 2. Need for Tdap vaccination Vaccination given. - Tdap vaccine greater than or equal to 7yo IM  We will plan to request labs from Dr. Graywall/gynecology. Patient will also check with her insurance regarding coverage of Shingrix vaccination.  We will request records from [facility] regarding immunizations. Did recommend the patient keep regularly scheduled follow-ups with dermatology as per their recommendations.  Sun protection/UV protection was discussed. Office visit was 30 minutes.  Greater than 50% of office visit was spent counseling and coordinating care. No follow-ups on file. Caren Macadam, MD 02/11/2018

## 2018-02-24 ENCOUNTER — Encounter: Payer: Self-pay | Admitting: Family Medicine

## 2018-02-25 ENCOUNTER — Encounter: Payer: Self-pay | Admitting: Family Medicine

## 2018-04-01 ENCOUNTER — Encounter: Payer: Self-pay | Admitting: Family Medicine

## 2018-04-01 ENCOUNTER — Other Ambulatory Visit: Payer: Self-pay | Admitting: *Deleted

## 2018-04-01 ENCOUNTER — Ambulatory Visit (INDEPENDENT_AMBULATORY_CARE_PROVIDER_SITE_OTHER): Payer: BLUE CROSS/BLUE SHIELD | Admitting: Family Medicine

## 2018-04-01 ENCOUNTER — Other Ambulatory Visit: Payer: Self-pay

## 2018-04-01 ENCOUNTER — Ambulatory Visit (HOSPITAL_COMMUNITY)
Admission: RE | Admit: 2018-04-01 | Discharge: 2018-04-01 | Disposition: A | Discharge status: Home / Self Care | Payer: BLUE CROSS/BLUE SHIELD | Source: Ambulatory Visit | Attending: Family Medicine | Admitting: Family Medicine

## 2018-04-01 VITALS — BP 132/82 | HR 82 | Temp 98.8°F | Resp 16 | Ht 64.0 in | Wt 160.1 lb

## 2018-04-01 DIAGNOSIS — R05 Cough: Secondary | ICD-10-CM | POA: Insufficient documentation

## 2018-04-01 DIAGNOSIS — R059 Cough, unspecified: Secondary | ICD-10-CM

## 2018-04-01 DIAGNOSIS — R918 Other nonspecific abnormal finding of lung field: Secondary | ICD-10-CM | POA: Diagnosis not present

## 2018-04-01 MED ORDER — CAFFEINE-MAGNESIUM SALICYLATE 50-162.5 MG PO TABS: 1.0000 | ORAL_TABLET | Freq: Two times a day (BID) | ORAL | Status: AC | PRN

## 2018-04-01 MED ORDER — SUPER B-COMPLEX PO TABS: 1.0000 | ORAL_TABLET | Freq: Every day | ORAL | Status: AC

## 2018-04-01 MED ORDER — AZITHROMYCIN 250 MG PO TABS: ORAL_TABLET | ORAL | 0 refills | Status: DC

## 2018-04-01 MED ORDER — OCUVITE ADULT 50+ PO CAPS: 1.0000 | ORAL_CAPSULE | Freq: Every day | ORAL | Status: AC

## 2018-04-01 NOTE — Progress Notes (Signed)
Patient ID: Martha Brock, female    DOB: 1956-09-13, 62 y.o.   MRN: [id number]  Chief Complaint  Patient presents with  . Hypertension    follow up    Allergies Other and Sulfa antibiotics  Subjective:   Martha Brock is a 62 y.o. female who presents to [facility] today.  HPI Mrs. Martha Brock presents for follow up today.  She is here for recheck of her blood pressure.  She has been working on eating less salt and trying to lose some weight.  She is active with her horses for exercise.  Her gynecology health maintenance and lab work is performed by Dr. Tressia Brock.  She reports she is up-to-date with her Pap, pelvic, breast exam, and mammography.  She had lab work done for routine health maintenance and screening by her gynecologist.  She is up-to-date with her colonoscopy.  She denies any allergies to dust, dander, foods.  She reports that she did have a shingles outbreak quite some time ago and after that was seen by Dr. Gwendel Brock, who is a pulmonologist.  She reports that she had some breathing tests done and those were within normal limits.  However, since those tests were performed she reports that she has had a cough which started back in the spring.  Reports that initially the cough started when all the pollen was out.  She reports that the cough never went away.  She reports that has persisted for around 6 weeks.  She does not feel short of breath.  She denies any wheezing.  No swelling in her lower extremities.  Denies any dyspnea on exertion, paroxysmal nocturnal dyspnea, or orthopnea.  Denies any chest pain.  Reports that the cough is dry in quality and bothers her multiple times of the day.  Feels like there is something at times that she needs to cough up or is in her throat but she never does.  Can swallow okay.  Denies any reflux or regurgitation of food.  Denies any dyspepsia.  Denies any heartburn.  Cough is not worse with nighttime or going to bed.  Denies any  pain.  No night sweats, fevers, chills.  No known TB exposure.  Has not traveled out of the country.  Does not smoke cigarettes.  Has no prior history of asthma.  Is not on any daily medications which could be causing her cough.  Reports that this all started when she thought she had some sinus and allergy issues and it is subsequently not gone away.  She never took antibiotics for her allergy/cold symptoms. She does keep regularly scheduled visits with her dermatologist secondary to her history of melanoma. Cough  This is a new problem. The current episode started more than 1 month ago (6 weeks). The problem has been unchanged. The problem occurs every few hours (On and off throughout the day). The cough is non-productive. Pertinent negatives include no chest pain, chills, ear congestion, ear pain, fever, headaches, heartburn, hemoptysis, myalgias, nasal congestion, postnasal drip, rash, rhinorrhea, sore throat, shortness of breath, sweats, weight loss or wheezing. Nothing aggravates the symptoms. 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 She has tried nothing for the symptoms. The treatment provided no relief. There is no history of asthma, bronchiectasis, bronchitis, COPD, emphysema, environmental allergies or pneumonia.    Past Medical History:  Diagnosis Date  . Allergy    hayfever  .  Anxiety, generalized   . Blood transfusion without reported diagnosis   . 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 Sinusitis     Past Surgical History:  Procedure Laterality Date  . CESAREAN SECTION     X 2  . COLONOSCOPY    . COLONOSCOPY N/A 09/25/2016   Procedure: COLONOSCOPY;  Surgeon: Martha Dolin, MD;  Location: [facility];  Service: Endoscopy;  Laterality: N/A;  9:45 AM  . Hysteroscopy and ablation     . MELANOMA EXCISION  2014   x 3    Family  History  Problem Relation Age of Onset  . Arthritis Mother   . Diabetes Mother   . Heart disease Mother   . Hypertension Mother   . Hyperlipidemia Mother   . Early death Father 70       quadriplegic  . AAA (abdominal aortic aneurysm) Father   . Hypertension Brother   . Diabetes Brother   . Epilepsy Brother   . Stroke Maternal Grandmother   . Heart disease Maternal Grandmother   . Heart disease Maternal Grandfather   . COPD Maternal Grandfather        farmers lung  . Colon cancer Neg Hx      Social History   Socioeconomic History  . Marital status: Married    Spouse name: Martha Brock  . Number of children: 2  . Years of education: 30  . Highest education level: Not on file  Occupational History  . Occupation: Emergency planning/management officer  Social Needs  . Financial resource strain: Not on file  . Food insecurity:    Worry: Not on file    Inability: Not on file  . Transportation needs:    Medical: Not on file    Non-medical: Not on file  Tobacco Use  . Smoking status: Never Smoker  . Smokeless tobacco: Never Used  Substance and Sexual Activity  . Alcohol use: Yes    Comment: occasionally  . Drug use: No  . Sexual activity: Yes    Birth control/protection: Surgical  Lifestyle  . Physical activity:    Days per week: Not on file    Minutes per session: Not on file  . Stress: Not on file  Relationships  . Social connections:    Talks on phone: Not on file    Gets together: Not on file    Attends religious service: Not on file    Active member of club or organization: Not on file    Attends meetings of clubs or organizations: Not on file    Relationship status: Not on file  Other Topics Concern  . Not on file  Social History Narrative   Lives with Martha Brock - husband of 5 years   [place] [place], [place].      Enjoys quarter horses   Self employed hair dresser   Eats all food groups.   Wear seat belt.   Exercise. Works 3 days a week. Has a farm.    Current Outpatient  Medications on File Prior to Visit  Medication Sig Dispense Refill  . Acetaminophen-Caffeine (EXCEDRIN TENSION HEADACHE) 500-65 MG TABS Take 2 tablets by mouth daily as needed (headaches).    . Ascorbic Acid (VITAMIN C) 1000 MG tablet Take 1,000 mg by mouth daily.    . Chlorpheniramine-Phenylephrine ([facility] SINUS [facility] ALLERGY MAX ST PO) Take 1 tablet by mouth daily as needed (allergies).    . Cholecalciferol (VITAMIN  D3) 3000 units TABS Take 3,000 Units by mouth daily.    . Diethylpropion HCl CR 75 MG TB24 Take 75 mg by mouth daily.    Marland Kitchen FLUoxetine (PROZAC) 20 MG capsule Take by mouth.    . Glucosamine HCl (GLUCOSAMINE PO) Take 1 tablet by mouth daily.    . Ibuprofen-Diphenhydramine HCl (ADVIL PM) 200-25 MG CAPS Take 1-2 tablets by mouth daily as needed (pain/sleep).     No current facility-administered medications on file prior to visit.     Review of Systems  Constitutional: Negative for chills, fever and weight loss.  HENT: Negative for drooling, ear discharge, ear pain, mouth sores, nosebleeds, postnasal drip, rhinorrhea, sinus pressure, sinus pain, sneezing, sore throat, trouble swallowing and voice change.   Respiratory: Negative for apnea, cough, hemoptysis, chest tightness, shortness of breath and wheezing.   Cardiovascular: Negative for chest pain, palpitations and leg swelling.  Gastrointestinal: Negative for abdominal pain, heartburn and nausea.  Musculoskeletal: Negative for myalgias.  Skin: Negative for rash.  Allergic/Immunologic: Negative for environmental allergies.  Neurological: Negative for dizziness, tremors, weakness, light-headedness, numbness and headaches.  Hematological: Negative for adenopathy. Does not bruise/bleed easily.  Psychiatric/Behavioral: Negative for dysphoric mood. The patient is not nervous/anxious.      Objective:   BP 132/82 (BP Location: Left Arm, Patient Position: Sitting, Cuff Size: Normal)   Pulse 82   Temp 98.8 F (37.1 C) (Temporal)   Resp  16   Ht 5\' 4"  (1.626 m)   Wt 160 lb 1.9 oz (72.6 kg)   SpO2 98%   BMI 27.48 kg/m   Physical Exam  Constitutional: She is oriented to person, place, and time. She appears well-developed and well-nourished.  HENT:  Head: Normocephalic and atraumatic.  Nose: Nose normal.  Mouth/Throat: Oropharynx is clear and moist. No oropharyngeal exudate.  Eyes: Pupils are equal, round, and reactive to light. Conjunctivae and EOM are normal. No scleral icterus.  Neck: Normal range of motion. Neck supple. No JVD present. No tracheal deviation present. No thyromegaly present.  Cardiovascular: Normal rate, regular rhythm and normal heart sounds.  No murmur heard. Pulmonary/Chest: Effort normal and breath sounds normal. No stridor. No respiratory distress. She has no wheezes.  Musculoskeletal: Normal range of motion.  Neurological: She is alert and oriented to person, place, and time. No cranial nerve deficit.  Skin: Skin is warm and dry. Capillary refill takes less than 2 seconds. No rash noted. No erythema.  Psychiatric: She has a normal mood and affect. Her behavior is normal. Judgment and thought content normal.  Vitals reviewed.  Depression screen [facility] 2/9 04/01/2018 05/21/2017  Decreased Interest 0 0  Down, Depressed, Hopeless 0 0  PHQ - 2 Score 0 0    Assessment and Plan  1. Cough Uncertain etiology.  Symptoms started after upper respiratory symptoms which she thought were allergy related.  She reports that she has no history of asthma or reactive airway disease.  Reports some pulmonary function testing done in the past but I do not have those records.  At this time we will go ahead and obtain chest x-ray.  We will treat for bronchitis at this time.  Treat with Zithromax Z-Pak as directed.Patient counseled in detail regarding the risks of medication. Told to call or return to clinic if develop any worrisome signs or symptoms. Patient voiced understanding.  Patient was counseled regarding worrisome  signs and symptoms of cough.  If she develops any shortness of breath, fever, or any other worrisome symptoms she  should go to the emergency department or call our office.  If her symptoms are not resolved will refer to pulmonology.  We did discuss the possibility of silent reflux causing her cough however she is asymptomatic in regards to any other symptoms of reflux/regurgitation.  She will follow-up with her new PCP and with her gynecologist.  She defers any lab work today and reports she get those with the gynecologist.  She will keep her appointments with the dermatologist.  Will call patient with chest x-ray results. - DG Chest 2 View; Future - azithromycin (ZITHROMAX) 250 MG tablet; Take two pills today and one pill each day for the next four days  Dispense: 6 tablet; Refill: 0 Form was signed today stating that patient is continuing up with her regular health maintenance. Return in about 1 month (around 04/29/2018). Caren Macadam, MD 04/01/2018

## 2018-04-04 ENCOUNTER — Telehealth: Payer: Self-pay

## 2018-04-04 NOTE — Telephone Encounter (Signed)
Opal Sidles with [facility] called giving chest xray results. Masslike opacity in right hilar region of chest. Chest CT recommended. Results in Epic.

## 2018-04-05 ENCOUNTER — Telehealth: Payer: Self-pay | Admitting: Family Medicine

## 2018-04-05 ENCOUNTER — Telehealth: Payer: Self-pay

## 2018-04-05 DIAGNOSIS — R059 Cough, unspecified: Secondary | ICD-10-CM

## 2018-04-05 DIAGNOSIS — R05 Cough: Secondary | ICD-10-CM

## 2018-04-05 DIAGNOSIS — R911 Solitary pulmonary nodule: Secondary | ICD-10-CM

## 2018-04-05 NOTE — Telephone Encounter (Signed)
Spoke with patient and she stated her husband called and the first one available was Aug 6th and that is the day they are coming back from the wedding in [place]. She stated she has a client that is a lung doctor and she will speak to him and he may be able to get her in sooner in [place]. I told her if he could, to call us back and let us know the appt date and have them fax [place] the results. She verbalized understanding.

## 2018-04-05 NOTE — Telephone Encounter (Signed)
Patients husband called to requesting a CT sooner than august. They will be out of town in august. Please call back [phone]/ [phone]

## 2018-04-05 NOTE — Telephone Encounter (Signed)
Called patient to discuss chest x-ray results  Chest x-ray performed 04/01/18 revealed a masslike opacity measuring up to 5.7 cm projects over right posterosuperior hilar/paraspinal region. No pleural effusion or pneumothorax. Normal cardiac silhouette. Bones are unremarkable.  This information was discussed with patient and the fact that she needed to get a CT of her chest for better characterize this area of concern.  She voiced understanding.  She reports that she will be working today and if we are unable to reach her by her cell phone to please call her husband with the date and time of the appointment.

## 2018-04-05 NOTE — Telephone Encounter (Signed)
Called patient to give her the date and time of chest ct which was scheduled for July 30. She stated they would be in [place] then. I gave her the number for central scheduling so she could reschedule the appointment herself.

## 2018-04-05 NOTE — Telephone Encounter (Signed)
I have called and discussed this with patient.  There is a telephone encounter regarding this.  CT scan of the chest with contrast has been ordered.

## 2018-04-05 NOTE — Telephone Encounter (Signed)
Called patient to give her the appointment information for her chest ct. When I gave her the date, she stated they would be in [place]. I gave the patient the number for central scheduling so that she could reschedule the appointment to a more convenient day and time for her.

## 2018-04-06 NOTE — Telephone Encounter (Signed)
Patient has scheduled for the first week of August. Martha Brock. [name], MD

## 2018-04-11 ENCOUNTER — Telehealth: Payer: Self-pay | Admitting: Family Medicine

## 2018-04-11 NOTE — Telephone Encounter (Signed)
Patient called in this morning wanting me to send Dr.Hagler a message to let her know that she is having pain in her stomach & back. I let her know that Dr.Hagler is not here on mondays. Please advise. Cb#: [phone] -patient also asked if she can call over to the radiology dept. To r/s her CT & I told her yes.

## 2018-04-12 NOTE — Telephone Encounter (Signed)
Please call and see how patient is doing.  She needs to be seen and evaluated.  If she cannot come in our office she should be seen and evaluated at the urgent care or emergency department.

## 2018-04-13 ENCOUNTER — Encounter (HOSPITAL_COMMUNITY): Payer: Self-pay | Admitting: *Deleted

## 2018-04-13 ENCOUNTER — Emergency Department (HOSPITAL_COMMUNITY)
Admission: EM | Admit: 2018-04-13 | Discharge: 2018-04-14 | Disposition: A | Discharge status: Home / Self Care | Payer: BLUE CROSS/BLUE SHIELD | Attending: Emergency Medicine | Admitting: Emergency Medicine

## 2018-04-13 ENCOUNTER — Emergency Department (HOSPITAL_COMMUNITY): Payer: BLUE CROSS/BLUE SHIELD

## 2018-04-13 ENCOUNTER — Other Ambulatory Visit: Payer: Self-pay

## 2018-04-13 DIAGNOSIS — Z79899 Other long term (current) drug therapy: Secondary | ICD-10-CM | POA: Insufficient documentation

## 2018-04-13 DIAGNOSIS — C799 Secondary malignant neoplasm of unspecified site: Secondary | ICD-10-CM | POA: Diagnosis not present

## 2018-04-13 DIAGNOSIS — R1010 Upper abdominal pain, unspecified: Secondary | ICD-10-CM | POA: Diagnosis present

## 2018-04-13 DIAGNOSIS — R079 Chest pain, unspecified: Secondary | ICD-10-CM | POA: Diagnosis not present

## 2018-04-13 DIAGNOSIS — Z85820 Personal history of malignant melanoma of skin: Secondary | ICD-10-CM | POA: Insufficient documentation

## 2018-04-13 LAB — CBC WITH DIFFERENTIAL/PLATELET
Basophils Absolute: 0 10*3/uL (ref 0.0–0.1)
Basophils Relative: 0 %
Eosinophils Absolute: 0.1 10*3/uL (ref 0.0–0.7)
Eosinophils Relative: 1 %
HCT: 39.2 % (ref 36.0–46.0)
Hemoglobin: 12.8 g/dL (ref 12.0–15.0)
Lymphocytes Relative: 27 %
Lymphs Abs: 2 10*3/uL (ref 0.7–4.0)
MCH: 30.3 pg (ref 26.0–34.0)
MCHC: 32.7 g/dL (ref 30.0–36.0)
MCV: 92.7 fL (ref 78.0–100.0)
Monocytes Absolute: 0.5 10*3/uL (ref 0.1–1.0)
Monocytes Relative: 6 %
Neutro Abs: 4.9 10*3/uL (ref 1.7–7.7)
Neutrophils Relative %: 66 %
Platelets: 360 10*3/uL (ref 150–400)
RBC: 4.23 MIL/uL (ref 3.87–5.11)
RDW: 12.6 % (ref 11.5–15.5)
WBC: 7.5 10*3/uL (ref 4.0–10.5)

## 2018-04-13 LAB — COMPREHENSIVE METABOLIC PANEL
ALT: 39 U/L (ref 0–44)
AST: 37 U/L (ref 15–41)
Albumin: 4.3 g/dL (ref 3.5–5.0)
Alkaline Phosphatase: 78 U/L (ref 38–126)
Anion gap: 9 (ref 5–15)
BUN: 16 mg/dL (ref 8–23)
CO2: 28 mmol/L (ref 22–32)
Calcium: 9.5 mg/dL (ref 8.9–10.3)
Chloride: 102 mmol/L (ref 98–111)
Creatinine, Ser: 0.51 mg/dL (ref 0.44–1.00)
GFR calc Af Amer: >60 mL/min (ref >60)
GFR calc non Af Amer: >60 mL/min (ref >60)
Glucose, Bld: 100 mg/dL — ABNORMAL HIGH (ref 70–99)
Potassium: 4 mmol/L (ref 3.5–5.1)
Sodium: 139 mmol/L (ref 135–145)
Total Bilirubin: 0.6 mg/dL (ref 0.3–1.2)
Total Protein: 7.8 g/dL (ref 6.5–8.1)

## 2018-04-13 LAB — LIPASE, BLOOD: Lipase: 30 U/L (ref 11–51)

## 2018-04-13 MED ORDER — IOPAMIDOL (ISOVUE-300) INJECTION 61%
100.0000 mL | Freq: Once | INTRAVENOUS | Status: AC | PRN
  Administered 2018-04-13: 100 mL via INTRAVENOUS

## 2018-04-13 NOTE — Telephone Encounter (Signed)
Called and spoke with patient and asked her how she was feeling. She stated about the same. The pain really doesn't start until she sits down at the end of the day. I advised her she needs to be evaluated at an ucc or the closest ED. She stated that she was working today and tomorrow and maybe she could go Friday. I told the patient her health was more important and the pain was not going away or getting any better and I understood she had to work, but she needed to be seen sooner rather than later. She said maybe she could go tonight after work. I asked her what time she got off of work and she stated 6:30. I told her to go to the ED, that the [facility] clinic would be closed by then. She verbalized understanding.

## 2018-04-13 NOTE — Discharge Instructions (Addendum)
Your CT scan shows multiple areas of cancer.  You will need to have an ultrasound biopsy of your liver lesion.  They should call you for this appointment but you may always call your primary care doctor or the oncology specialist to set this up.

## 2018-04-13 NOTE — ED Triage Notes (Signed)
Pt c/o abd pain and mid back pain that started 7 weeks ago, pt states that she had a chest xray performed and was told to come to er,

## 2018-04-13 NOTE — ED Provider Notes (Signed)
[facility] EMERGENCY DEPARTMENT Provider Note   CSN: [id number] Arrival date & time: 04/13/18  1914     History   Chief Complaint Chief Complaint  Patient presents with  . Abdominal Pain    HPI Martha Brock is a 62 y.o. female.  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  She states she was called by her primary care doctor's office today and told to go to the ER for a CT.  She has been having back pain, chest burning, and upper abdominal pain for about 7 weeks.  Saw her doctor because of a cough during this time as well.  Chest x-ray on 7/12 shows left sided mass.  Is scheduled for a CT scan but this will not be until August 6.  It is unknown why the doctor called her today but they were told to get a CT scan emergently.  The abdominal pain is upper.  No vomiting or fevers.  No history of smoking.   Past Medical History:  Diagnosis Date  . Allergy    hayfever  . Anxiety, generalized   . Blood transfusion without reported diagnosis   . Cancer ([place])    scalp melanoma, 2014 [facility], surgery, no chemo, no radiation  . Sinusitis     Patient Active Problem List   Diagnosis Date Noted  . History of melanoma 05/21/2017  . Anxiety, generalized 05/21/2017  . Environmental allergies 05/21/2017    Past Surgical History:  Procedure Laterality Date  . CESAREAN SECTION     X 2  . COLONOSCOPY    . COLONOSCOPY N/A 09/25/2016   Procedure: COLONOSCOPY;  Surgeon: Daneil Dolin, MD;  Location: [facility];  Service: Endoscopy;  Laterality: N/A;  9:45 AM  . Hysteroscopy and ablation     . MELANOMA EXCISION  2014   x 3     OB History   None      Home Medications    Prior to Admission medications   Medication Sig Start Date End Date Taking? Authorizing Provider  Acetaminophen-Caffeine (EXCEDRIN TENSION HEADACHE) 500-65 MG TABS Take 2 tablets by mouth daily as needed (headaches).   Yes [provider]  Ascorbic Acid (VITAMIN C) 1000 MG tablet Take 1,000 mg by mouth at bedtime.    Yes [provider]  B Complex-Biotin-FA (SUPER B-COMPLEX) TABS Take 1 tablet by mouth daily. 04/01/18  Yes Susy Frizzle, MD  Caffeine-Magnesium Salicylate (DIUREX) 83-382.5 MG TABS Take 1 tablet by mouth 3 times/day as needed-between meals & bedtime. 04/01/18  Yes Susy Frizzle, MD  Chlorpheniramine-Phenylephrine (CVS SINUS & ALLERGY MAX ST PO) Take 1 tablet by mouth daily as needed (allergies).   Yes [provider]  Cholecalciferol (VITAMIN D3) 3000 units TABS Take 3,000 Units by mouth at bedtime.    Yes [provider]  Diethylpropion HCl CR 75 MG TB24 Take 0.5-1 mg by mouth daily.    Yes [provider]  FLUoxetine (PROZAC) 20 MG capsule Take 20 mg by mouth daily as needed (for mood).    Yes [provider]  Glucosamine HCl (GLUCOSAMINE PO) Take 2 tablets by mouth every morning.    Yes [provider]  Ibuprofen-Diphenhydramine HCl (ADVIL PM) 200-25 MG CAPS Take 2 tablets by mouth at bedtime.    Yes [provider]  Multiple Vitamins-Minerals (OCUVITE ADULT 50+) CAPS Take 1 capsule by mouth daily. 04/01/18  Yes Susy Frizzle, MD  traMADol (ULTRAM) 50 MG tablet Take 50 mg by mouth daily as needed for moderate pain or severe pain.   Yes [provider]    Family History Family History  Problem Relation Age of Onset  . Arthritis Mother   . Diabetes Mother   . Heart disease Mother   . Hypertension Mother   . Hyperlipidemia Mother   . Early death Father 26       quadriplegic  . AAA (abdominal aortic aneurysm) Father   . Hypertension Brother   . Diabetes Brother   . Epilepsy Brother   . Stroke Maternal Grandmother   . Heart disease Maternal Grandmother   . Heart disease Maternal Grandfather   . COPD Maternal Grandfather        farmers lung  . Colon cancer Neg Hx     Social History Social History   Tobacco  Use  . Smoking status: Never Smoker  . Smokeless tobacco: Never Used  Substance Use Topics  . Alcohol use: Yes    Comment: occasionally  . Drug use: No     Allergies   Other and Sulfa antibiotics   Review of Systems Review of Systems  Constitutional: Negative for fever.  Respiratory: Positive for cough. Negative for shortness of breath.   Cardiovascular: Positive for chest pain.  Gastrointestinal: Positive for abdominal pain. Negative for vomiting.  Musculoskeletal: Positive for back pain.  All other systems reviewed and are negative.    Physical Exam Updated Vital Signs BP (!) 161/83 (BP Location: Right Arm)   Pulse 92   Temp 97.9 F (36.6 C) (Oral)   Resp 16   SpO2 98%   Physical Exam  Constitutional: She is oriented to person, place, and time. She appears well-developed and well-nourished.  Non-toxic appearance. She does not appear ill. No distress.  HENT:  Head: Normocephalic and atraumatic.  Right Ear: External ear normal.  Left Ear: External ear normal.  Nose: Nose normal.  Eyes: Right eye exhibits no discharge. Left eye exhibits no discharge.  Cardiovascular: Normal rate, regular rhythm and normal heart sounds.  Pulmonary/Chest: Effort normal and breath sounds normal.  Abdominal: Soft. There is tenderness in the right upper quadrant, epigastric area and left upper quadrant.  Neurological: She is alert and oriented to person, place, and time.  Skin: Skin is warm and dry.  Nursing note and vitals reviewed.    ED Treatments / Results  Labs (all labs ordered are listed, but only abnormal results are displayed) Labs Reviewed  COMPREHENSIVE METABOLIC PANEL - Abnormal; Notable for the following components:      Result Value   Glucose, Bld 100 (*)    All other components within normal limits  CBC WITH DIFFERENTIAL/PLATELET  LIPASE, BLOOD    EKG None  Radiology Ct Chest W Contrast  Result Date: 04/13/2018 CLINICAL DATA:  Chest and abdominal pain for  7 weeks. EXAM: CT CHEST, ABDOMEN, AND PELVIS WITH CONTRAST TECHNIQUE: Multidetector CT imaging of the chest, abdomen and pelvis was performed following the standard protocol during bolus administration of intravenous contrast. CONTRAST:  164mL ISOVUE-300 IOPAMIDOL (ISOVUE-300) INJECTION 61% COMPARISON:  None. FINDINGS: CT CHEST FINDINGS Cardiovascular: No significant vascular findings. Normal heart size. No pericardial effusion. Mediastinum/Nodes: No enlarged mediastinal, hilar, or axillary lymph nodes. Thyroid gland, trachea, and esophagus demonstrate no significant findings. Lungs/Pleura: 5.9 x 4.6 cm mass is noted posteriorly in the right upper lobe adjacent to right major fissure consistent with malignancy. 4.3 x 3.3  cm mass is noted in the posterior portion of left lung base consistent with malignancy. No pneumothorax or pleural effusion is noted. 1 cm nodule is seen in left lower lobe concerning for metastatic disease. Musculoskeletal: No chest wall mass or suspicious bone lesions identified. CT ABDOMEN PELVIS FINDINGS Hepatobiliary: No gallstones are noted. Multiple rounded hypodense lesions are seen in the liver consistent with metastatic disease. The largest measures 4.6 x 4.5 cm in the left hepatic lobe. Pancreas: 3.8 x 2.6 cm low density is noted in pancreatic head consistent with malignancy. 3.2 x 2.8 cm low density is noted in pancreatic body consistent with malignancy. Mild ductal dilatation is noted in the pancreatic tail. Spleen: Normal in size without focal abnormality. Adrenals/Urinary Tract: Adrenal glands are unremarkable. Kidneys are normal, without renal calculi, focal lesion, or hydronephrosis. Bladder is unremarkable. Stomach/Bowel: The stomach appears normal. There is no evidence of bowel obstruction or inflammation. The appendix is not clearly visualized. Vascular/Lymphatic: No significant vascular findings are present. No enlarged abdominal or pelvic lymph nodes. Reproductive: Uterus and  ovaries appear unremarkable. Enlarged varices are seen bilaterally in the pelvis with enlarged bilateral ovarian veins suggesting pelvic congestion syndrome. Other: No abdominal wall hernia or abnormality. No abdominopelvic ascites. Musculoskeletal: No acute or significant osseous findings. IMPRESSION: Large bilateral pulmonary masses are noted, with the largest on the right measuring 5.9 x 4.6 cm and the largest on the left measuring 4.3 x 3.3 cm. These are most consistent with pulmonary metastatic disease or malignancy. Multiple hepatic masses are also noted consistent with metastatic disease. The largest measures 4.6 x 4.5 cm in left hepatic lobe. Multiple hypoechoic masses are noted in the pancreas, with 3.8 x 2.6 cm mass in pancreatic head and 3.2 x 2.8 cm mass in pancreatic body. Mild ductal dilatation is noted in pancreatic tail. These are concerning for malignancy or metastatic disease is well. Based on the above findings, PET scan is recommended for further evaluation. Enlarged bilateral pelvic varices are noted with enlarged bilateral ovarian veins consistent with pelvic congestion syndrome. Electronically Signed   By: Marijo Conception, M.D.   On: 04/13/2018 22:57   Ct Abdomen Pelvis W Contrast  Result Date: 04/13/2018 CLINICAL DATA:  Chest and abdominal pain for 7 weeks. EXAM: CT CHEST, ABDOMEN, AND PELVIS WITH CONTRAST TECHNIQUE: Multidetector CT imaging of the chest, abdomen and pelvis was performed following the standard protocol during bolus administration of intravenous contrast. CONTRAST:  139mL ISOVUE-300 IOPAMIDOL (ISOVUE-300) INJECTION 61% COMPARISON:  None. FINDINGS: CT CHEST FINDINGS Cardiovascular: No significant vascular findings. Normal heart size. No pericardial effusion. Mediastinum/Nodes: No enlarged mediastinal, hilar, or axillary lymph nodes. Thyroid gland, trachea, and esophagus demonstrate no significant findings. Lungs/Pleura: 5.9 x 4.6 cm mass is noted posteriorly in the right  upper lobe adjacent to right major fissure consistent with malignancy. 4.3 x 3.3 cm mass is noted in the posterior portion of left lung base consistent with malignancy. No pneumothorax or pleural effusion is noted. 1 cm nodule is seen in left lower lobe concerning for metastatic disease. Musculoskeletal: No chest wall mass or suspicious bone lesions identified. CT ABDOMEN PELVIS FINDINGS Hepatobiliary: No gallstones are noted. Multiple rounded hypodense lesions are seen in the liver consistent with metastatic disease. The largest measures 4.6 x 4.5 cm in the left hepatic lobe. Pancreas: 3.8 x 2.6 cm low density is noted in pancreatic head consistent with malignancy. 3.2 x 2.8 cm low density is noted in pancreatic body consistent with malignancy. Mild ductal dilatation is noted in  the pancreatic tail. Spleen: Normal in size without focal abnormality. Adrenals/Urinary Tract: Adrenal glands are unremarkable. Kidneys are normal, without renal calculi, focal lesion, or hydronephrosis. Bladder is unremarkable. Stomach/Bowel: The stomach appears normal. There is no evidence of bowel obstruction or inflammation. The appendix is not clearly visualized. Vascular/Lymphatic: No significant vascular findings are present. No enlarged abdominal or pelvic lymph nodes. Reproductive: Uterus and ovaries appear unremarkable. Enlarged varices are seen bilaterally in the pelvis with enlarged bilateral ovarian veins suggesting pelvic congestion syndrome. Other: No abdominal wall hernia or abnormality. No abdominopelvic ascites. Musculoskeletal: No acute or significant osseous findings. IMPRESSION: Large bilateral pulmonary masses are noted, with the largest on the right measuring 5.9 x 4.6 cm and the largest on the left measuring 4.3 x 3.3 cm. These are most consistent with pulmonary metastatic disease or malignancy. Multiple hepatic masses are also noted consistent with metastatic disease. The largest measures 4.6 x 4.5 cm in left  hepatic lobe. Multiple hypoechoic masses are noted in the pancreas, with 3.8 x 2.6 cm mass in pancreatic head and 3.2 x 2.8 cm mass in pancreatic body. Mild ductal dilatation is noted in pancreatic tail. These are concerning for malignancy or metastatic disease is well. Based on the above findings, PET scan is recommended for further evaluation. Enlarged bilateral pelvic varices are noted with enlarged bilateral ovarian veins consistent with pelvic congestion syndrome. Electronically Signed   By: Marijo Conception, M.D.   On: 04/13/2018 22:57    Procedures Procedures (including critical care time)  Medications Ordered in ED Medications  iopamidol (ISOVUE-300) 61 % injection 100 mL (100 mLs Intravenous Contrast Given 04/13/18 2209)     Initial Impression / Assessment and Plan / ED Course  I have reviewed the triage vital signs and the nursing notes.  Pertinent labs & imaging results that were available during my care of the patient were reviewed by me and considered in my medical decision making (see chart for details).     Given her pain in her chest, abdomen, back and these masses seen on x-ray, CT obtained.  Unfortunately shows metastatic cancer as above.  I have discussed this with the patient.  Discussed with oncology, Dr. Delton Coombes, who recommends an outpatient biopsy of her liver to be ordered and he will follow her up as an outpatient.  Their office will call the patient for follow-up.  I have also stressed she should follow-up with her PCP.  However there is no indication for an emergent admission or other treatments.  Labs are benign.  I discussed this diagnosis with the patient, and have answered the questions to the best of my ability.  Understands plan of care and will follow up with PCP and oncology.  Final Clinical Impressions(s) / ED Diagnoses   Final diagnoses:  Metastatic cancer [facility])    ED Discharge Orders        Ordered    US BIOPSY (LIVER)     04/13/18 2316         Sherwood Gambler, MD 04/14/18 0009

## 2018-04-15 ENCOUNTER — Telehealth: Payer: Self-pay | Admitting: Family Medicine

## 2018-04-15 NOTE — Telephone Encounter (Signed)
Called patient to see how she is doing . Reports that she is going to follow up on Monday/Tuesday with oncologist at [facility]. Reports that she is doing well. Reports that she is dealing with the news of the metastatic cancer well and that she is thankful for all of our help.  Martha Brock. [name], MD

## 2018-04-19 ENCOUNTER — Other Ambulatory Visit (HOSPITAL_COMMUNITY): Payer: BLUE CROSS/BLUE SHIELD

## 2018-04-19 DIAGNOSIS — R918 Other nonspecific abnormal finding of lung field: Secondary | ICD-10-CM | POA: Insufficient documentation

## 2018-04-19 DIAGNOSIS — K7689 Other specified diseases of liver: Secondary | ICD-10-CM | POA: Insufficient documentation

## 2018-04-20 ENCOUNTER — Telehealth (HOSPITAL_COMMUNITY): Payer: Self-pay

## 2018-04-20 NOTE — Telephone Encounter (Signed)
Called to reschedule biopsy, no answer, left vm. AW  

## 2018-04-21 DIAGNOSIS — C787 Secondary malignant neoplasm of liver and intrahepatic bile duct: Secondary | ICD-10-CM | POA: Insufficient documentation

## 2018-04-22 ENCOUNTER — Ambulatory Visit (HOSPITAL_COMMUNITY)
Admission: RE | Admit: 2018-04-22 | Discharge: 2018-04-22 | Disposition: A | Discharge status: Home / Self Care | Payer: BLUE CROSS/BLUE SHIELD | Source: Ambulatory Visit | Attending: Emergency Medicine | Admitting: Emergency Medicine

## 2018-04-22 ENCOUNTER — Encounter (HOSPITAL_COMMUNITY): Payer: Self-pay

## 2018-04-26 ENCOUNTER — Ambulatory Visit (HOSPITAL_COMMUNITY): Payer: BLUE CROSS/BLUE SHIELD

## 2018-04-29 ENCOUNTER — Inpatient Hospital Stay (HOSPITAL_COMMUNITY): Payer: BLUE CROSS/BLUE SHIELD | Attending: Hematology | Admitting: Hematology

## 2018-05-02 ENCOUNTER — Encounter: Payer: Self-pay | Admitting: Family Medicine

## 2018-05-02 ENCOUNTER — Ambulatory Visit (HOSPITAL_COMMUNITY): Payer: BLUE CROSS/BLUE SHIELD

## 2018-05-02 ENCOUNTER — Ambulatory Visit (INDEPENDENT_AMBULATORY_CARE_PROVIDER_SITE_OTHER): Payer: BLUE CROSS/BLUE SHIELD | Admitting: Family Medicine

## 2018-05-02 VITALS — BP 130/78 | HR 112 | Temp 98.2°F | Resp 16 | Ht 64.0 in | Wt 162.0 lb

## 2018-05-02 DIAGNOSIS — C787 Secondary malignant neoplasm of liver and intrahepatic bile duct: Secondary | ICD-10-CM

## 2018-05-02 DIAGNOSIS — Z7689 Persons encountering health services in other specified circumstances: Secondary | ICD-10-CM

## 2018-05-02 DIAGNOSIS — Z23 Encounter for immunization: Secondary | ICD-10-CM | POA: Diagnosis not present

## 2018-05-02 DIAGNOSIS — F411 Generalized anxiety disorder: Secondary | ICD-10-CM | POA: Diagnosis not present

## 2018-05-02 DIAGNOSIS — Z1159 Encounter for screening for other viral diseases: Secondary | ICD-10-CM | POA: Diagnosis not present

## 2018-05-02 DIAGNOSIS — C801 Malignant (primary) neoplasm, unspecified: Secondary | ICD-10-CM | POA: Insufficient documentation

## 2018-05-02 NOTE — Addendum Note (Signed)
Addended by: Shary Decamp B on: 05/02/2018 02:36 PM   Modules accepted: Orders

## 2018-05-02 NOTE — Progress Notes (Signed)
Subjective:    Patient ID: Martha Brock, female    DOB: May 09, 1956, 62 y.o.   MRN: [id number]  HPI Very pleasant 62 year old WF here to establish care. Unfortunately, was recently found to have metastatic melanoma.  Originally removed from the scalp.  Has now spread to lungs, liver, and pancreas.  She is seeing oncology at [facility] and they are planning immunotherapy to begin soon.  She is due for pneumovax 23.  Prevnar 13 are up to date.  Due for a flu shot this fall.  Does request Hep C screen.  Colonoscopy was in 2018 and is not due again until 2028.  Mammogram and pap were performed by her GYN earlier this year and were normal.  Most recent shot records are listed below: Immunization History  Administered Date(s) Administered  . Influenza,inj,Quad PF,6+ Mos 05/21/2017  . Pneumococcal Conjugate-13 05/21/2017  . Tdap 02/11/2018  . Zoster Recombinat (Shingrix) 02/11/2018   GYN is checking her cholesterol per her report.  Recent CBC and CMP were WNL Admission on 04/13/2018, Discharged on 04/14/2018  Component Date Value Ref Range Status  . WBC 04/13/2018 7.5  4.0 - 10.5 K/uL Final  . RBC 04/13/2018 4.23  3.87 - 5.11 MIL/uL Final  . Hemoglobin 04/13/2018 12.8  12.0 - 15.0 g/dL Final  . HCT 04/13/2018 39.2  36.0 - 46.0 % Final  . MCV 04/13/2018 92.7  78.0 - 100.0 fL Final  . MCH 04/13/2018 30.3  26.0 - 34.0 pg Final  . MCHC 04/13/2018 32.7  30.0 - 36.0 g/dL Final  . RDW 04/13/2018 12.6  11.5 - 15.5 % Final  . Platelets 04/13/2018 360  150 - 400 K/uL Final  . Neutrophils Relative % 04/13/2018 66  % Final  . Neutro Abs 04/13/2018 4.9  1.7 - 7.7 K/uL Final  . Lymphocytes Relative 04/13/2018 27  % Final  . Lymphs Abs 04/13/2018 2.0  0.7 - 4.0 K/uL Final  . Monocytes Relative 04/13/2018 6  % Final  . Monocytes Absolute 04/13/2018 0.5  0.1 - 1.0 K/uL Final  . Eosinophils Relative 04/13/2018 1  % Final  . Eosinophils Absolute 04/13/2018 0.1  0.0 - 0.7 K/uL Final  . Basophils Relative  04/13/2018 0  % Final  . Basophils Absolute 04/13/2018 0.0  0.0 - 0.1 K/uL Final   Performed at [facility], [address] [place], [place] [address]  . Sodium 04/13/2018 139  135 - 145 mmol/L Final  . Potassium 04/13/2018 4.0  3.5 - 5.1 mmol/L Final  . Chloride 04/13/2018 102  98 - 111 mmol/L Final  . CO2 04/13/2018 28  22 - 32 mmol/L Final  . Glucose, Bld 04/13/2018 100* 70 - 99 mg/dL Final  . BUN 04/13/2018 16  8 - 23 mg/dL Final  . Creatinine, Ser 04/13/2018 0.51  0.44 - 1.00 mg/dL Final  . Calcium 04/13/2018 9.5  8.9 - 10.3 mg/dL Final  . Total Protein 04/13/2018 7.8  6.5 - 8.1 g/dL Final  . Albumin 04/13/2018 4.3  3.5 - 5.0 g/dL Final  . AST 04/13/2018 37  15 - 41 U/L Final  . ALT 04/13/2018 39  0 - 44 U/L Final  . Alkaline Phosphatase 04/13/2018 78  38 - 126 U/L Final  . Total Bilirubin 04/13/2018 0.6  0.3 - 1.2 mg/dL Final  . GFR calc non Af Amer 04/13/2018 >60  >60 mL/min Final  . GFR calc Af Amer 04/13/2018 >60  >60 mL/min Final   Comment: (NOTE) The eGFR has  been calculated using the CKD EPI equation. This calculation has not been validated in all clinical situations. eGFR's persistently <60 mL/min signify possible Chronic Kidney Disease.   Georgiann Hahn gap 04/13/2018 9  5 - 15 Final   Performed at [facility], [address], [place], [place] [address]  . Lipase 04/13/2018 30  11 - 51 U/L Final   Performed at [facility], [address], [place], [place] [address]   Past Medical History:  Diagnosis Date  . Allergy    hayfever  . Anxiety, generalized   . Cancer ([place])    scalp melanoma, 2014 [facility], surgery, no chemo, no radiation  . Melanoma ([place])    CT lungs, liver and pancreas  . Sinusitis    Past Surgical History:  Procedure Laterality Date  . CESAREAN SECTION     X 2  . COLONOSCOPY    . COLONOSCOPY N/A 09/25/2016   Procedure: COLONOSCOPY;  Surgeon: Daneil Dolin, MD;  Location: [facility];  Service: Endoscopy;  Laterality: N/A;  9:45 AM  . Hysteroscopy and  ablation     . MELANOMA EXCISION  2014   x 3   Current Outpatient Medications on File Prior to Visit  Medication Sig Dispense Refill  . Acetaminophen-Caffeine (EXCEDRIN TENSION HEADACHE) 500-65 MG TABS Take 2 tablets by mouth daily as needed (headaches).    . Ascorbic Acid (VITAMIN C) 1000 MG tablet Take 1,000 mg by mouth at bedtime.     . B Complex-Biotin-FA (SUPER B-COMPLEX) TABS Take 1 tablet by mouth daily.    . Caffeine-Magnesium Salicylate (DIUREX) 59-741.6 MG TABS Take 1 tablet by mouth 3 times/day as needed-between meals & bedtime.    . Chlorpheniramine-Phenylephrine (CVS SINUS & ALLERGY MAX ST PO) Take 1 tablet by mouth daily as needed (allergies).    . Cholecalciferol (VITAMIN D3) 3000 units TABS Take 3,000 Units by mouth at bedtime.     . clonazePAM (KLONOPIN) 0.5 MG tablet at bedtime.     . Diethylpropion HCl CR 75 MG TB24 Take 0.5-1 mg by mouth daily.     Marland Kitchen FLUoxetine (PROZAC) 20 MG capsule Take 20 mg by mouth daily as needed (for mood).     . Glucosamine HCl (GLUCOSAMINE PO) Take 2 tablets by mouth every morning.     . Ibuprofen-Diphenhydramine HCl (ADVIL PM) 200-25 MG CAPS Take 2 tablets by mouth at bedtime.     . Multiple Vitamins-Minerals (OCUVITE ADULT 50+) CAPS Take 1 capsule by mouth daily.    . traMADol (ULTRAM) 50 MG tablet Take 50 mg by mouth daily as needed for moderate pain or severe pain.     No current facility-administered medications on file prior to visit.    Allergies  Allergen Reactions  . Other Itching, Rash and Shortness Of Breath    Urostat  . Sulfa Antibiotics Nausea Only   Social History   Socioeconomic History  . Marital status: Married    Spouse name: Richardson Landry  . Number of children: 2  . Years of education: 63  . Highest education level: Not on file  Occupational History  . Occupation: Emergency planning/management officer  Social Needs  . Financial resource strain: Not on file  . Food insecurity:    Worry: Not on file    Inability: Not on file  . Transportation  needs:    Medical: Not on file    Non-medical: Not on file  Tobacco Use  . Smoking status: Never Smoker  . Smokeless tobacco: Never Used  Substance and Sexual Activity  . Alcohol use: Yes    Comment: occasionally  . Drug use: No  . Sexual activity: Yes    Birth control/protection: Surgical  Lifestyle  . Physical activity:    Days per week: Not on file    Minutes per session: Not on file  . Stress: Not on file  Relationships  . Social connections:    Talks on phone: Not on file    Gets together: Not on file    Attends religious service: Not on file    Active member of club or organization: Not on file    Attends meetings of clubs or organizations: Not on file    Relationship status: Not on file  . Intimate partner violence:    Fear of current or ex partner: Not on file    Emotionally abused: Not on file    Physically abused: Not on file    Forced sexual activity: Not on file  Other Topics Concern  . Not on file  Social History Narrative   Lives with Richardson Landry - husband of 5 years   [address] [place], [place].      Enjoys quarter horses   Self employed hair dresser   Eats all food groups.   Wear seat belt.   Exercise. Works 3 days a week. Has a farm.    Family History  Problem Relation Age of Onset  . Arthritis Mother   . Diabetes Mother   . Heart disease Mother   . Hypertension Mother   . Hyperlipidemia Mother   . Early death Father 35       quadriplegic  . AAA (abdominal aortic aneurysm) Father   . Hypertension Brother   . Diabetes Brother   . Epilepsy Brother   . Stroke Maternal Grandmother   . Heart disease Maternal Grandmother   . Heart disease Maternal Grandfather   . COPD Maternal Grandfather        farmers lung  . Colon cancer Neg Hx       Review of Systems  All other systems reviewed and are negative.      Objective:   Physical Exam  Constitutional: She is oriented to person, place, and time. She appears well-developed and  well-nourished.  HENT:  Head: Normocephalic and atraumatic.  Right Ear: External ear normal.  Left Ear: External ear normal.  Nose: Nose normal.  Mouth/Throat: Oropharynx is clear and moist.  Eyes: Pupils are equal, round, and reactive to light. Conjunctivae and EOM are normal.  Neck: Normal range of motion. Neck supple. No JVD present. No tracheal deviation present. No thyromegaly present.  Cardiovascular: Normal rate, regular rhythm, normal heart sounds and intact distal pulses. Exam reveals no gallop and no friction rub.  No murmur heard. Pulmonary/Chest: Effort normal and breath sounds normal. No stridor. No respiratory distress. She has no wheezes. She has no rales. She exhibits no tenderness.  Abdominal: Soft. Bowel sounds are normal. She exhibits no distension and no mass. There is no tenderness. There is no rebound and no guarding. No hernia.  Musculoskeletal: She exhibits no edema.  Lymphadenopathy:    She has no cervical adenopathy.  Neurological: She is alert and oriented to person, place, and time. She displays normal reflexes. No cranial nerve deficit or sensory deficit. She exhibits normal muscle tone. Coordination normal.  Skin: No rash noted. She is not diaphoretic.  Psychiatric: She has a normal mood and affect. Her behavior is normal.  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  Using klonopin to help her sleep at night.  Has follow up with oncology at [facility] scheduled.  Colonoscopy,pap, and mammogram are UTD.  Received pneumovax 23 today.  Other preventative care is utd.  Recheck in 6 months or as needed

## 2018-05-03 LAB — HEPATITIS C ANTIBODY
Hepatitis C Ab: NONREACTIVE
SIGNAL TO CUT-OFF: 0.01 (ref <1.00)

## 2019-06-24 IMAGING — DX DG CHEST 2V
2 series · 2 of 2 positions shown · non-contrast
Comparison: None.

CLINICAL DATA: 61 y/o  F; 6 weeks of nonproductive cough.

EXAM:
CHEST - 2 VIEW

[chest pa]
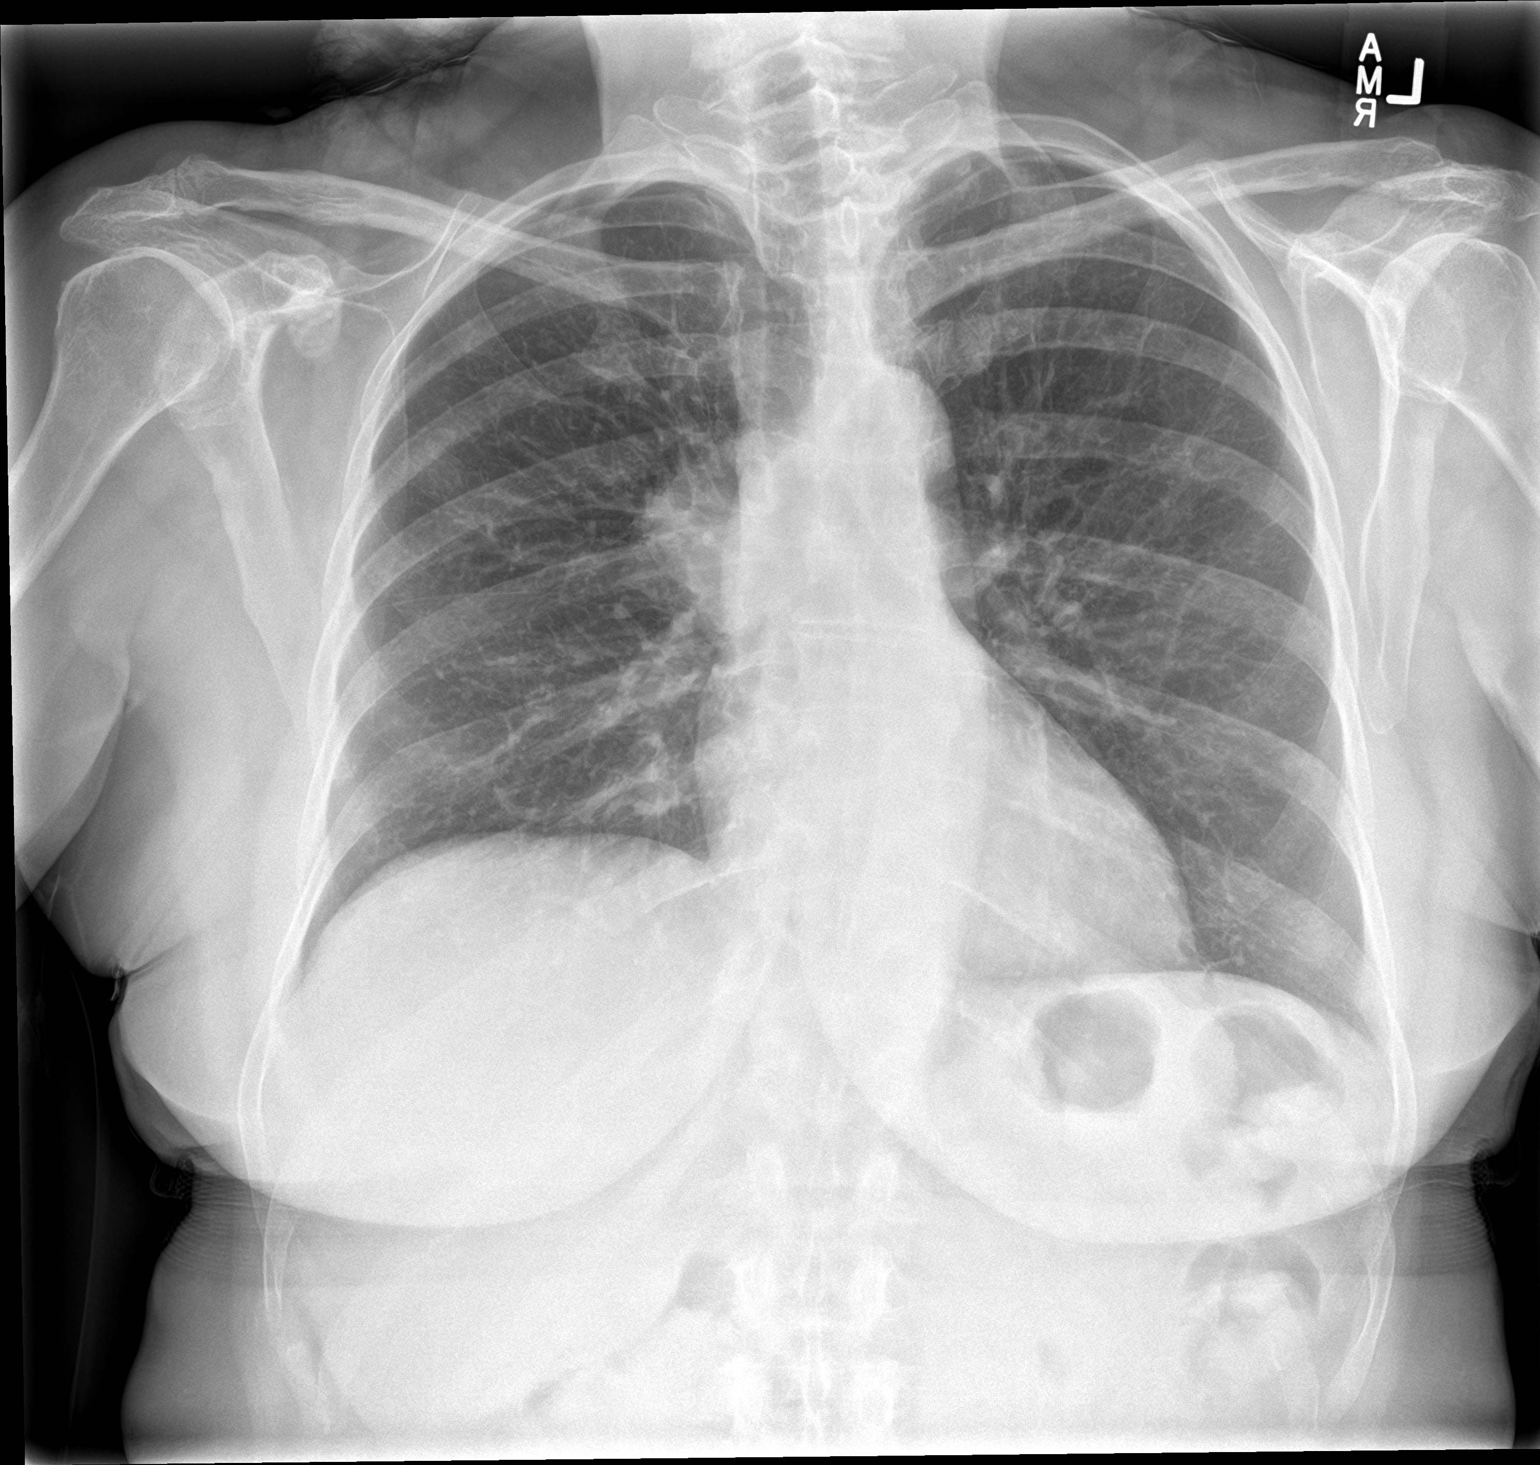

[chest lat]
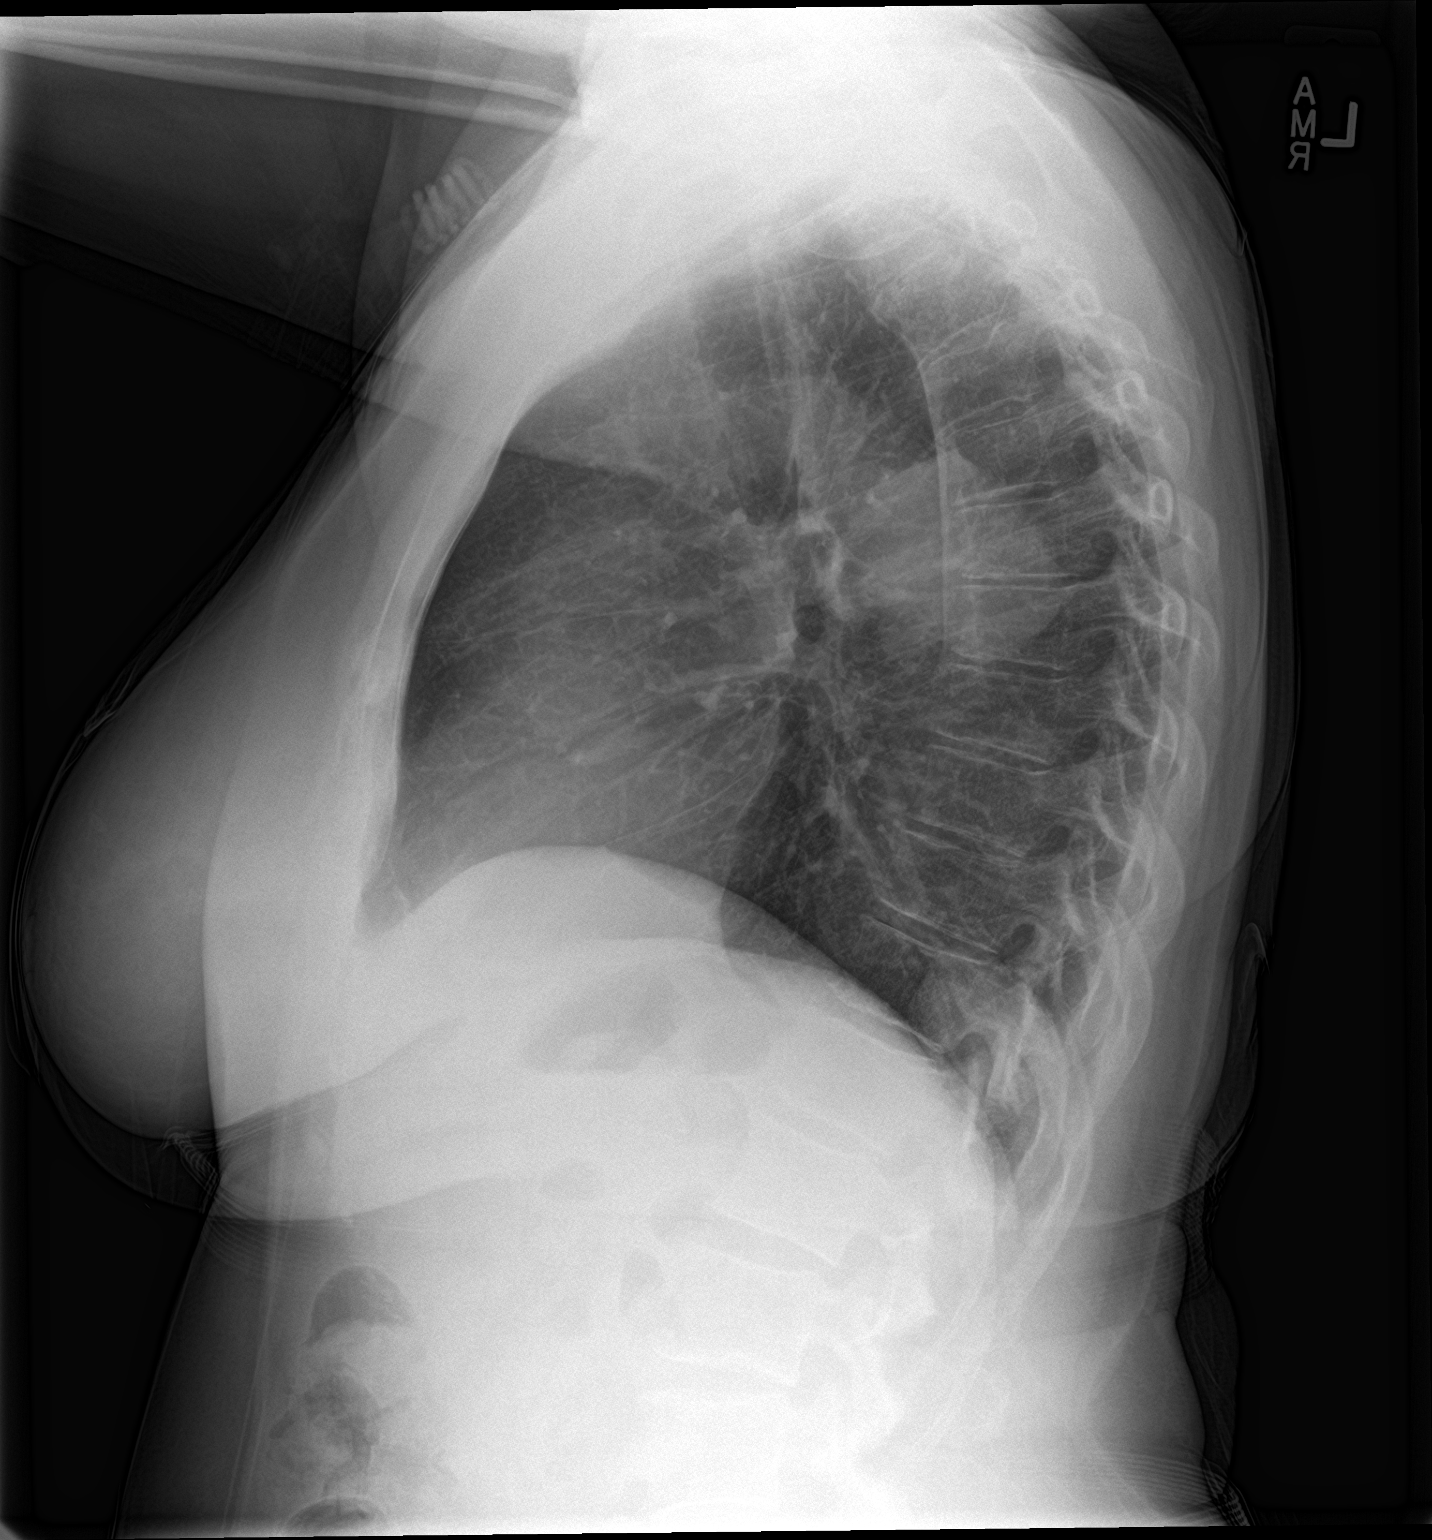

[2 of 2 positions shown; findings below may reference images not displayed]

FINDINGS: Masslike opacity measuring up to 5.7 cm projects over right
posterosuperior hilar/paraspinal region. No pleural effusion or
pneumothorax. Normal cardiac silhouette. Bones are unremarkable.
IMPRESSION: Masslike opacity measuring up to 5.7 cm projects over right
posterosuperior hilar/paraspinal region. Chest CT is recommended to
further characterize.

These results will be called to the ordering clinician or
representative by the Radiologist Assistant, and communication
documented in the PACS or zVision Dashboard.

By: Gopeechand Magina M.D.

## 2019-07-06 IMAGING — CT CT ABD-PELV W/ CM
2 of 5 series · 12 of 36 positions shown, 15 images · IV contrast (Isovue)
Comparison: None.

CLINICAL DATA: Chest and abdominal pain for 7 weeks.

EXAM:
CT CHEST, ABDOMEN, AND PELVIS WITH CONTRAST
TECHNIQUE: Multidetector CT imaging of the chest, abdomen and pelvis was
performed following the standard protocol during bolus
administration of intravenous contrast.
CONTRAST:  100mL [id number] IOPAMIDOL ([id number]) INJECTION 61%

[Series 2: cap with · axial · 0.74mm/px · z∈[-615,-135]mm · 9 of 120 slices shown, 12 images]
[im 12/120  mediastinal]
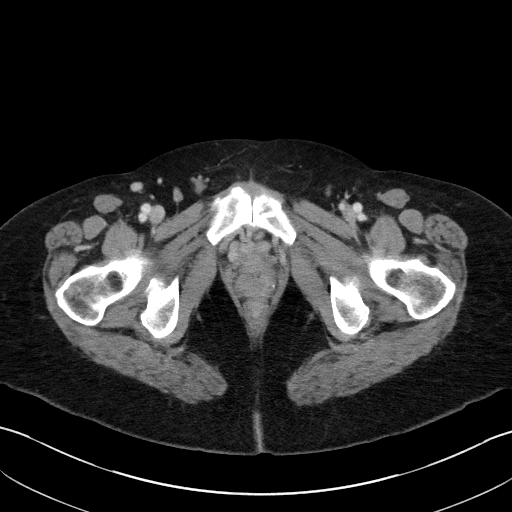
[im 12/120  lung]
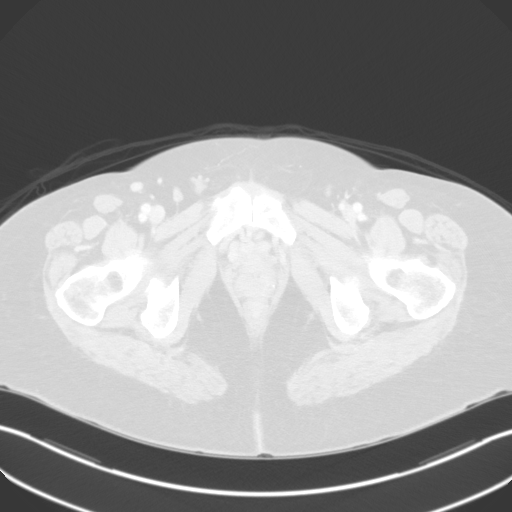
[im 24/120  lung]
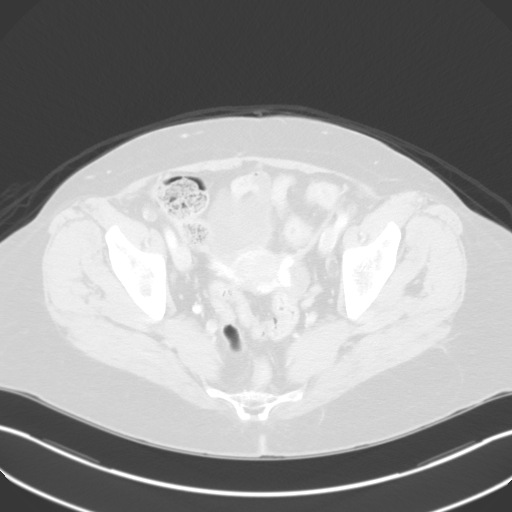
[im 36/120  lung]
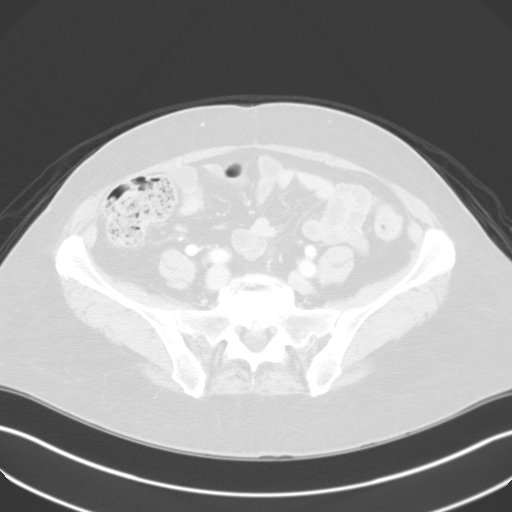
[im 48/120  lung]
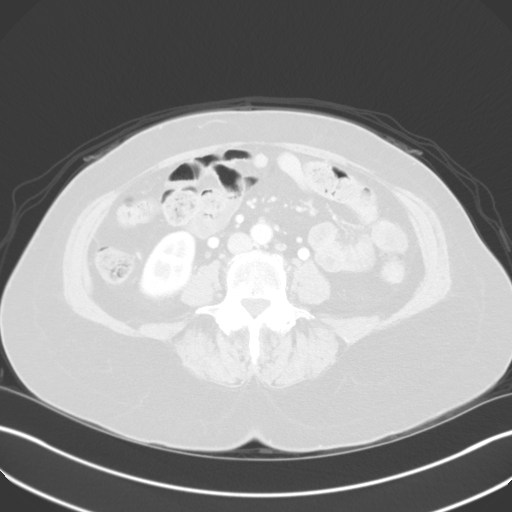
[im 60/120  mediastinal]
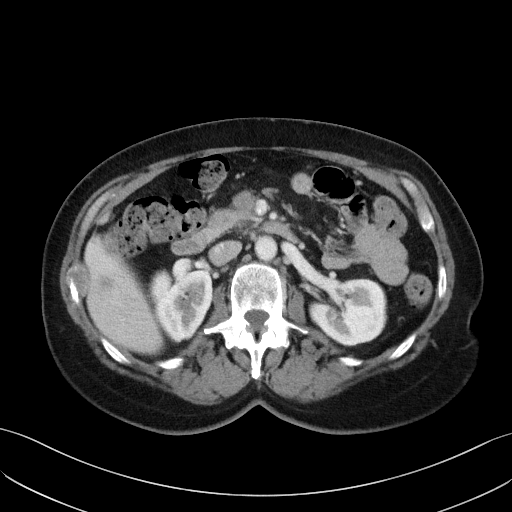
[im 60/120  lung]
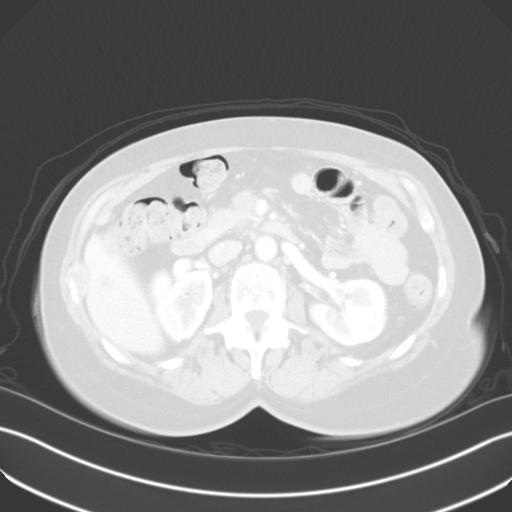
[im 72/120  lung]
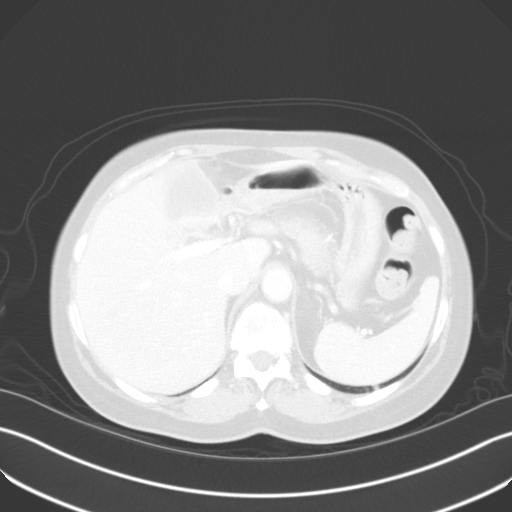
[im 84/120  lung]
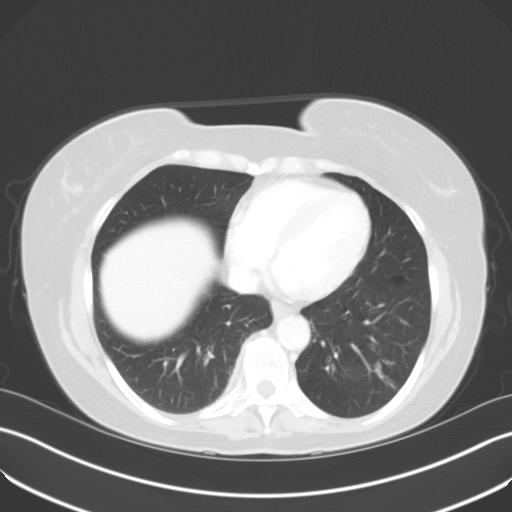
[im 96/120  lung]
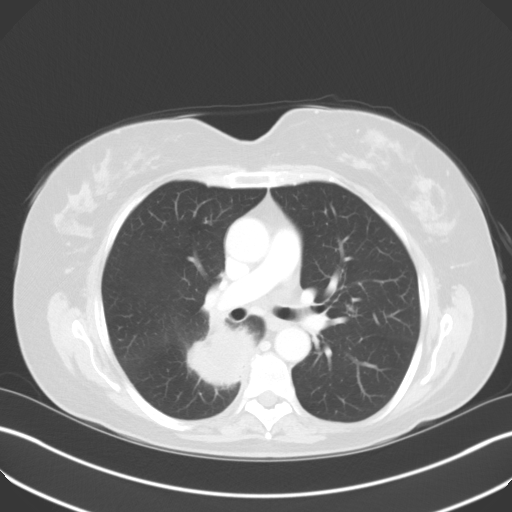
[im 108/120  mediastinal]
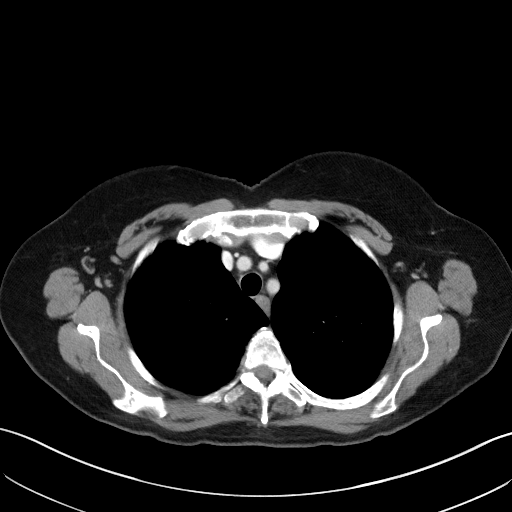
[im 108/120  lung]
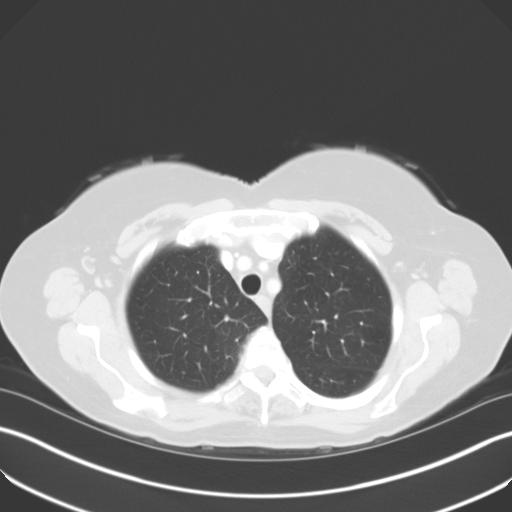

[Series 5: coronals · coronal · 0.82mm/px · 3 of 137 slices shown]
[im 28/137  lung]
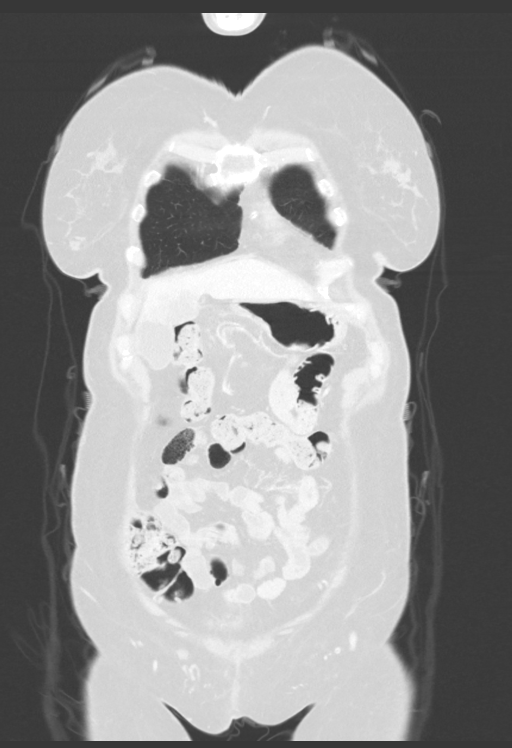
[im 55/137  lung]
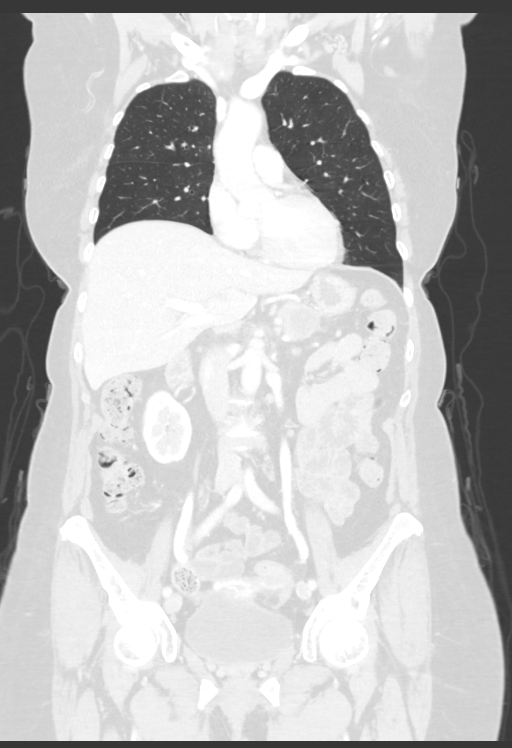
[im 82/137  lung]
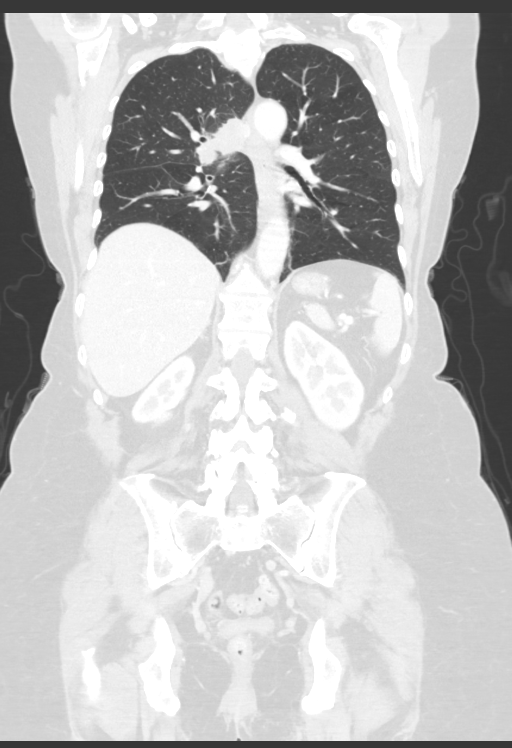

[12 of 36 positions shown; findings below may reference images not displayed]

FINDINGS: CT CHEST FINDINGS

Cardiovascular: No significant vascular findings. Normal heart size.
No pericardial effusion.

Mediastinum/Nodes: No enlarged mediastinal, hilar, or axillary lymph
nodes. Thyroid gland, trachea, and esophagus demonstrate no
significant findings.

Lungs/Pleura: 5.9 x 4.6 cm mass is noted posteriorly in the right
upper lobe adjacent to right major fissure consistent with
malignancy. 4.3 x 3.3 cm mass is noted in the posterior portion of
left lung base consistent with malignancy. No pneumothorax or
pleural effusion is noted. 1 cm nodule is seen in left lower lobe
concerning for metastatic disease.

Musculoskeletal: No chest wall mass or suspicious bone lesions
identified.

CT ABDOMEN PELVIS FINDINGS

Hepatobiliary: No gallstones are noted. Multiple rounded hypodense
lesions are seen in the liver consistent with metastatic disease.
The largest measures 4.6 x 4.5 cm in the left hepatic lobe.

Pancreas: 3.8 x 2.6 cm low density is noted in pancreatic head
consistent with malignancy. 3.2 x 2.8 cm low density is noted in
pancreatic body consistent with malignancy. Mild ductal dilatation
is noted in the pancreatic tail.

Spleen: Normal in size without focal abnormality.

Adrenals/Urinary Tract: Adrenal glands are unremarkable. Kidneys are
normal, without renal calculi, focal lesion, or hydronephrosis.
Bladder is unremarkable.

Stomach/Bowel: The stomach appears normal. There is no evidence of
bowel obstruction or inflammation. The appendix is not clearly
visualized.

Vascular/Lymphatic: No significant vascular findings are present. No
enlarged abdominal or pelvic lymph nodes.

Reproductive: Uterus and ovaries appear unremarkable. Enlarged
varices are seen bilaterally in the pelvis with enlarged bilateral
ovarian veins suggesting pelvic congestion syndrome.

Other: No abdominal wall hernia or abnormality. No abdominopelvic
ascites.

Musculoskeletal: No acute or significant osseous findings.
IMPRESSION: Large bilateral pulmonary masses are noted, with the largest on the
right measuring 5.9 x 4.6 cm and the largest on the left measuring
4.3 x 3.3 cm. These are most consistent with pulmonary metastatic
disease or malignancy.

Multiple hepatic masses are also noted consistent with metastatic
disease. The largest measures 4.6 x 4.5 cm in left hepatic lobe.

Multiple hypoechoic masses are noted in the pancreas, with 3.8 x
cm mass in pancreatic head and 3.2 x 2.8 cm mass in pancreatic body.
Mild ductal dilatation is noted in pancreatic tail. These are
concerning for malignancy or metastatic disease is well.

Based on the above findings, PET scan is recommended for further
evaluation.

Enlarged bilateral pelvic varices are noted with enlarged bilateral
ovarian veins consistent with pelvic congestion syndrome.

## 2019-11-20 DEATH — deceased
